# Patient Record
Sex: Female | Born: 1980 | Race: Black or African American | Hispanic: No | Marital: Single | Smoking: Never smoker
Health system: Southern US, Community
[De-identification: ages and names within clinical notes are randomized; demographics above are authoritative.]

---
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
## 2003-08-31 ENCOUNTER — Emergency Department (HOSPITAL_COMMUNITY): Admission: EM | Admit: 2003-08-31 | Discharge: 2003-09-01 | Payer: Self-pay | Admitting: Emergency Medicine

## 2003-09-03 ENCOUNTER — Emergency Department (HOSPITAL_COMMUNITY): Admission: EM | Admit: 2003-09-03 | Discharge: 2003-09-03 | Payer: Self-pay | Admitting: Emergency Medicine

## 2006-06-15 ENCOUNTER — Emergency Department (HOSPITAL_COMMUNITY): Admission: EM | Admit: 2006-06-15 | Discharge: 2006-06-15 | Payer: Self-pay | Admitting: Emergency Medicine

## 2006-08-13 ENCOUNTER — Emergency Department (HOSPITAL_COMMUNITY): Admission: EM | Admit: 2006-08-13 | Discharge: 2006-08-14 | Payer: Self-pay | Admitting: Emergency Medicine

## 2006-08-21 ENCOUNTER — Other Ambulatory Visit: Admission: RE | Admit: 2006-08-21 | Discharge: 2006-08-21 | Payer: Self-pay | Admitting: Obstetrics and Gynecology

## 2006-08-31 ENCOUNTER — Emergency Department (HOSPITAL_COMMUNITY): Admission: EM | Admit: 2006-08-31 | Discharge: 2006-08-31 | Payer: Self-pay | Admitting: Emergency Medicine

## 2006-09-02 ENCOUNTER — Inpatient Hospital Stay (HOSPITAL_COMMUNITY): Admission: AD | Admit: 2006-09-02 | Discharge: 2006-09-02 | Payer: Self-pay | Admitting: Obstetrics and Gynecology

## 2006-11-11 ENCOUNTER — Ambulatory Visit (HOSPITAL_COMMUNITY): Admission: RE | Admit: 2006-11-11 | Discharge: 2006-11-11 | Payer: Self-pay | Admitting: Obstetrics and Gynecology

## 2007-03-26 ENCOUNTER — Inpatient Hospital Stay (HOSPITAL_COMMUNITY): Admission: AD | Admit: 2007-03-26 | Discharge: 2007-03-28 | Payer: Self-pay | Admitting: Obstetrics and Gynecology

## 2007-05-03 ENCOUNTER — Emergency Department (HOSPITAL_COMMUNITY): Admission: EM | Admit: 2007-05-03 | Discharge: 2007-05-03 | Payer: Self-pay | Admitting: Emergency Medicine

## 2007-07-05 ENCOUNTER — Emergency Department (HOSPITAL_COMMUNITY): Admission: EM | Admit: 2007-07-05 | Discharge: 2007-07-05 | Payer: Self-pay | Admitting: Emergency Medicine

## 2008-05-05 ENCOUNTER — Emergency Department (HOSPITAL_COMMUNITY): Admission: EM | Admit: 2008-05-05 | Discharge: 2008-05-06 | Payer: Self-pay | Admitting: Emergency Medicine

## 2009-02-06 ENCOUNTER — Emergency Department (HOSPITAL_COMMUNITY): Admission: EM | Admit: 2009-02-06 | Discharge: 2009-02-06 | Payer: Self-pay | Admitting: Emergency Medicine

## 2009-02-10 ENCOUNTER — Emergency Department (HOSPITAL_COMMUNITY): Admission: EM | Admit: 2009-02-10 | Discharge: 2009-02-10 | Payer: Self-pay | Admitting: Emergency Medicine

## 2010-06-06 LAB — URINALYSIS, ROUTINE W REFLEX MICROSCOPIC
Glucose, UA: NEGATIVE mg/dL
Protein, ur: NEGATIVE mg/dL
Specific Gravity, Urine: 1.014 (ref 1.005–1.030)
Urobilinogen, UA: 1 mg/dL (ref 0.0–1.0)

## 2010-06-06 LAB — URINE MICROSCOPIC-ADD ON

## 2010-06-06 LAB — URINE CULTURE

## 2010-06-06 LAB — RAPID STREP SCREEN (MED CTR MEBANE ONLY): Streptococcus, Group A Screen (Direct): NEGATIVE

## 2010-06-15 LAB — POCT PREGNANCY, URINE: Preg Test, Ur: NEGATIVE

## 2010-06-15 LAB — URINE MICROSCOPIC-ADD ON

## 2010-06-15 LAB — DIFFERENTIAL
Basophils Absolute: 0 10*3/uL (ref 0.0–0.1)
Basophils Relative: 0 % (ref 0–1)
Eosinophils Absolute: 0.1 10*3/uL (ref 0.0–0.7)
Eosinophils Relative: 1 % (ref 0–5)
Monocytes Absolute: 0.7 10*3/uL (ref 0.1–1.0)

## 2010-06-15 LAB — CBC
HCT: 36.7 % (ref 36.0–46.0)
Hemoglobin: 12.5 g/dL (ref 12.0–15.0)
MCHC: 34 g/dL (ref 30.0–36.0)
MCV: 86.8 fL (ref 78.0–100.0)
RDW: 14.1 % (ref 11.5–15.5)

## 2010-06-15 LAB — URINALYSIS, ROUTINE W REFLEX MICROSCOPIC
Glucose, UA: NEGATIVE mg/dL
Protein, ur: 30 mg/dL — AB

## 2010-06-15 LAB — POCT I-STAT, CHEM 8
BUN: 5 mg/dL — ABNORMAL LOW (ref 6–23)
Calcium, Ion: 1.13 mmol/L (ref 1.12–1.32)
Creatinine, Ser: 1 mg/dL (ref 0.4–1.2)
Sodium: 144 mEq/L (ref 135–145)
TCO2: 26 mmol/L (ref 0–100)

## 2010-06-15 LAB — URINE CULTURE: Colony Count: >=100000

## 2010-11-23 LAB — CBC
HCT: 32.8 — ABNORMAL LOW
HCT: 38
Hemoglobin: 11 — ABNORMAL LOW
Hemoglobin: 12.9
MCHC: 33.6
MCV: 85.6
MCV: 88
Platelets: 319
RBC: 4.44
RDW: 12.6
WBC: 14.3 — ABNORMAL HIGH

## 2010-12-20 LAB — KLEIHAUER-BETKE STAIN
Fetal Cells %: 0
Quantitation Fetal Hemoglobin: 0

## 2010-12-20 LAB — CBC
MCHC: 32.9
RDW: 12.5

## 2010-12-21 LAB — WET PREP, GENITAL
Clue Cells Wet Prep HPF POC: NONE SEEN
Trich, Wet Prep: NONE SEEN
WBC, Wet Prep HPF POC: NONE SEEN
Yeast Wet Prep HPF POC: NONE SEEN

## 2010-12-21 LAB — URINALYSIS, ROUTINE W REFLEX MICROSCOPIC
Glucose, UA: NEGATIVE
Protein, ur: NEGATIVE
Urobilinogen, UA: 1

## 2010-12-21 LAB — URINE CULTURE

## 2010-12-21 LAB — GC/CHLAMYDIA PROBE AMP, GENITAL: GC Probe Amp, Genital: NEGATIVE

## 2010-12-21 LAB — URINE MICROSCOPIC-ADD ON

## 2010-12-24 ENCOUNTER — Emergency Department (HOSPITAL_COMMUNITY)
Admission: EM | Admit: 2010-12-24 | Discharge: 2010-12-24 | Disposition: A | Discharge status: Home / Self Care | Payer: Self-pay | Attending: Emergency Medicine | Admitting: Emergency Medicine

## 2010-12-24 DIAGNOSIS — R3915 Urgency of urination: Secondary | ICD-10-CM | POA: Insufficient documentation

## 2010-12-24 DIAGNOSIS — N390 Urinary tract infection, site not specified: Secondary | ICD-10-CM | POA: Insufficient documentation

## 2010-12-24 DIAGNOSIS — R300 Dysuria: Secondary | ICD-10-CM | POA: Insufficient documentation

## 2010-12-24 DIAGNOSIS — R350 Frequency of micturition: Secondary | ICD-10-CM | POA: Insufficient documentation

## 2010-12-24 DIAGNOSIS — R319 Hematuria, unspecified: Secondary | ICD-10-CM | POA: Insufficient documentation

## 2010-12-24 LAB — URINALYSIS, ROUTINE W REFLEX MICROSCOPIC
Nitrite: POSITIVE — AB
Protein, ur: 30 mg/dL — AB
Urobilinogen, UA: 1 mg/dL (ref 0.0–1.0)

## 2010-12-24 LAB — POCT PREGNANCY, URINE: Preg Test, Ur: NEGATIVE

## 2010-12-24 LAB — URINE MICROSCOPIC-ADD ON

## 2011-05-25 ENCOUNTER — Emergency Department (HOSPITAL_COMMUNITY)
Admission: EM | Admit: 2011-05-25 | Discharge: 2011-05-25 | Disposition: A | Discharge status: Home / Self Care | Payer: Self-pay | Attending: Emergency Medicine | Admitting: Emergency Medicine

## 2011-05-25 ENCOUNTER — Encounter (HOSPITAL_COMMUNITY): Payer: Self-pay | Admitting: Emergency Medicine

## 2011-05-25 DIAGNOSIS — A64 Unspecified sexually transmitted disease: Secondary | ICD-10-CM | POA: Insufficient documentation

## 2011-05-25 DIAGNOSIS — L293 Anogenital pruritus, unspecified: Secondary | ICD-10-CM | POA: Insufficient documentation

## 2011-05-25 LAB — URINALYSIS, ROUTINE W REFLEX MICROSCOPIC
Glucose, UA: NEGATIVE mg/dL
Hgb urine dipstick: NEGATIVE
Ketones, ur: NEGATIVE mg/dL
pH: 7.5 (ref 5.0–8.0)

## 2011-05-25 LAB — WET PREP, GENITAL: Trich, Wet Prep: NONE SEEN

## 2011-05-25 LAB — URINE MICROSCOPIC-ADD ON

## 2011-05-25 MED ORDER — AZITHROMYCIN 250 MG PO TABS
1000.0000 mg | ORAL_TABLET | Freq: Once | ORAL | Status: AC
  Administered 2011-05-25: 1000 mg via ORAL
  Filled 2011-05-25: qty 4

## 2011-05-25 MED ORDER — CEFTRIAXONE SODIUM 250 MG IJ SOLR
250.0000 mg | Freq: Once | INTRAMUSCULAR | Status: AC
  Administered 2011-05-25: 250 mg via INTRAMUSCULAR
  Filled 2011-05-25: qty 250

## 2011-05-25 MED ORDER — LIDOCAINE HCL (PF) 1 % IJ SOLN
INTRAMUSCULAR | Status: AC
  Administered 2011-05-25: 22:00:00
  Filled 2011-05-25: qty 5

## 2011-05-25 NOTE — ED Notes (Signed)
Lab called and add on Preg test ordered.

## 2011-05-25 NOTE — Discharge Instructions (Signed)
You have been treated in the emergency department for an infection, possibly sexually transmitted. Results of your gonorrhea and chlamydia tests are pending and you will be notified if they are positive. It is very important to practice safe sex and use condoms when sexually active. If your results are positive you need to notify all sexual partners so they can be treated as well. The website http://www.dontspreadit.com/ can be used to send anonymous text messages or emails to alert sexual contacts. Follow up with your doctor, or OBGYN in regards to today's visit.   ° °Gonorrhea and Chlamydia °SYMPTOMS  °In females, symptoms may go unnoticed. Symptoms that are more noticeable can include:  °Belly (abdominal) pain.  °Painful intercourse.  °Watery mucous-like discharge from the vagina.  °Miscarriage.  °Discomfort when urinating.  °Inflammation of the rectum.  °Abnormal gray-green frothy vaginal discharge  °Vaginal itching and irritatio  °Itching and irritation of the area outside the vagina.   °Painful urination.  °Bleeding after sexual intercourse.  °In males, symptoms include:  °Burning with urination.  °Pain in the testicles.  °Watery mucous-like discharge from the penis.  °It can cause longstanding (chronic) pelvic pain after frequent infections.  °TREATMENT  °PID can cause women to not be able to have children (sterile) if left untreated or if half-treated.  It is important to finish ALL medications given to you.  °This is a sexually transmitted infection. So you are also at risk for other sexually transmitted diseases, including HIV (AIDS), it is recommended that you get tested. °HOME CARE INSTRUCTIONS  °Warning: This infection is contagious. Do not have sex until treatment is completed. Follow up at your caregiver's office or the clinic to which you were referred. If your diagnosis (learning what is wrong) is confirmed by culture or some other method, your recent sexual contacts need treatment. Even if they are  symptom free or have a negative culture or evaluation, they should be treated.  °PREVENTION  °Women should use sanitary pads instead of tampons for vaginal discharge.  °Wipe front to back after using the toilet and avoid douching.   °Practice safe sex, use condoms, have only one sex partner and be sure your sex partner is not having sex with others.  °Ask your caregiver to test you for chlamydia at your regular checkups or sooner if you are having symptoms.  °Ask for further information if you are pregnant.  °SEEK IMMEDIATE MEDICAL CARE IF:  °You develop an oral temperature above 102° F (38.9° C), not controlled by medications or lasting more than 2 days.  °You develop an increase in pain.  °You develop any type of abnormal discharge.  °You develop vaginal bleeding and it is not time for your period.  °You develop painful intercourse.  °  ° ° °RESOURCE GUIDE ° °Dental Problems ° °Patients with Medicaid: °[facility]                     [facility] °[address]                                           [address] °Phone:  [phone]                                                    Phone:  [phone] ° °If unable to pay or uninsured, contact:  [facility] or [facility] to become qualified for the adult dental clinic. ° °Chronic Pain Problems °Contact [facility]  [phone] °Patients need to be referred by their primary care doctor. ° °Insufficient Money for Medicine °Contact [employer]:  call "211" or [facility] 271-5999. ° °No Primary Care Doctor °Call [facility]  832-8000 °Other agencies that provide inexpensive medical care °   [facility]  832-8035 °   [facility]  832-7272 °   [facility]  271-5999 °   [facility]  832-4777 °   [facility]  373-0678 °   [facility]  [phone] ° °Psychological Services °[facility]  [phone] °[facility]   [phone] °[facility]   800 853-5163 (emergency services 641-4993) ° °Substance Abuse Resources °[facility]  336-882-2125 °[facility] 336-784-9470 °[facility] 336-285-9073 °[facility] 336-845-3988 °Residential & Outpatient Substance Abuse Program  800-659-3381 ° °Abuse/Neglect °[place] [facility] (336) 641-3795 °[place] [facility] 800-378-5315 (After Hours) ° °Emergency Shelter °[facility] (336) 271-5985 ° °Maternity Homes °[facility] (336) 275-9566 °[facility] (704) 372-4663 ° °MRSA Hotline #:   832-7006 ° ° ° °[place] [facility] ° °[facility] [facility]     [employer]                          [facility] °[address] [place]                       [address]      [address]  °[place]                                                [place]                            [place] °Phone:  349-3220                                   Phone:  [phone]                 Phone:  342-8140 ° °[facility] °Phone:  342-8316 ° °[place] [facility] °(336) 342-1394 °(336) 342-3537 (After Hours) ° ° °

## 2011-05-25 NOTE — ED Provider Notes (Signed)
Patient to resume Dr. Patrica Duel.  Patient emergency department with chief complaint of vaginal itching.  There was mild tenderness on pelvic exam with no cervical motion tenderness.  Patient is pending results of the wet prep prior to discharge and we'll treat patient accordingly.  Microscopic wet-mount exam shows white blood cells TNTC.  Will treat patient prophylactically for gonorrhea and Chlamydia.Patient to be discharged with instructions to follow up with OBGYN ([facility] clinic) Discussed importance of using protection when sexually active. Pt understands that they have GC/Chlamydia cultures pending and that they will need to inform all sexual partners if results return positive. Pt has been treated prophylacticly with azithromycin and rocephin due to pts history, pelvic exam, and wet prep with increased WBCs. Pt not concerning for PID because hemodynamically stable and no cervical motion tenderness on pelvic exam.   Jaci Carrel, PA-C 05/25/11 2109

## 2011-05-25 NOTE — ED Notes (Signed)
Patient states that she has had a vaginal discharge for a couple of days and thinks it is a yeast infection.  Denies being on any antibiotics

## 2011-05-25 NOTE — ED Notes (Signed)
Onset 2 days ago vaginal itching slight discharge clear white. Denies urinary complaints.

## 2011-05-25 NOTE — ED Provider Notes (Signed)
History     CSN: [id number]  Arrival date & time 05/25/11  1705   First MD Initiated Contact with Patient 05/25/11 1920      Chief Complaint  Patient presents with  . Vaginal Itching   patient has had vaginal itching, and vaginal irritation over the past 2 days. She has a new white discharge. She denies any dysuria or hematuria. She has had no fevers no abdominal pain. She apparently is actually active. She did try Monistat over the counter, but states it did not help her symptoms.  (Consider location/radiation/quality/duration/timing/severity/associated sxs/prior treatment) HPI  History reviewed. No pertinent past medical history.  History reviewed. No pertinent past surgical history.  No family history on file.  History  Substance Use Topics  . Smoking status: Never Smoker   . Smokeless tobacco: Not on file  . Alcohol Use: No    OB History    Grav Para Term Preterm Abortions TAB SAB Ect Mult Living                  Review of Systems  All other systems reviewed and are negative.    Allergies  Review of patient's allergies indicates no known allergies.  Home Medications  No current outpatient prescriptions on file.  BP 113/60  Pulse 107  Temp(Src) 98.3 F (36.8 C) (Oral)  Resp 20  SpO2 100%  Physical Exam  Nursing note and vitals reviewed. Constitutional: She appears well-developed and well-nourished. No distress.  HENT:  Head: Normocephalic.  Eyes: Pupils are equal, round, and reactive to light.  Cardiovascular: Normal heart sounds.   Pulmonary/Chest: Breath sounds normal.  Abdominal: Soft.  Genitourinary: Vagina normal.       Female nursing chaperone present during exam. No obvious external lesions. White, frothy discharge at the cervix noted. No bleeding no masses.  Musculoskeletal: Normal range of motion.  Neurological: She is alert.  Skin: Skin is warm and dry.    ED Course  Procedures (including critical care time)  Labs Reviewed    URINALYSIS, ROUTINE W REFLEX MICROSCOPIC - Abnormal; Notable for the following:    APPearance HAZY (*)    Leukocytes, UA LARGE (*)    All other components within normal limits  URINE MICROSCOPIC-ADD ON - Abnormal; Notable for the following:    Squamous Epithelial / LPF FEW (*)    Bacteria, UA FEW (*)    All other components within normal limits  GC/CHLAMYDIA PROBE AMP, GENITAL  WET PREP, GENITAL  PREGNANCY, URINE   No results found.   No diagnosis found.    MDM  Patient is seen and examined, initial history and physical is completed. Evaluation initiated    GC and Chlamydia probe has been sent off. Pelvic exam done with female nursing supervision. Wet prep, and urine also pending.    Almena Hokenson A. Patrica Duel, MD 05/25/11 1950

## 2011-05-26 LAB — GC/CHLAMYDIA PROBE AMP, GENITAL: GC Probe Amp, Genital: NEGATIVE

## 2011-06-02 NOTE — ED Provider Notes (Signed)
Medical screening examination/treatment/procedure(s) were conducted as a shared visit with non-physician practitioner(s) and myself.  I personally evaluated the patient during the encounter   Byrl Latin A. Patrica Duel, MD 06/02/11 [phone]

## 2012-08-27 ENCOUNTER — Emergency Department (HOSPITAL_BASED_OUTPATIENT_CLINIC_OR_DEPARTMENT_OTHER)
Admission: EM | Admit: 2012-08-27 | Discharge: 2012-08-28 | Disposition: A | Discharge status: Home / Self Care | Payer: 59 | Attending: Emergency Medicine | Admitting: Emergency Medicine

## 2012-08-27 DIAGNOSIS — N390 Urinary tract infection, site not specified: Secondary | ICD-10-CM | POA: Insufficient documentation

## 2012-08-27 DIAGNOSIS — Z3202 Encounter for pregnancy test, result negative: Secondary | ICD-10-CM | POA: Insufficient documentation

## 2012-08-27 DIAGNOSIS — R1013 Epigastric pain: Secondary | ICD-10-CM | POA: Insufficient documentation

## 2012-08-27 NOTE — ED Notes (Signed)
Pt c/o pain in throat and upper abd pain.

## 2012-08-28 LAB — URINE MICROSCOPIC-ADD ON

## 2012-08-28 LAB — CBC WITH DIFFERENTIAL/PLATELET
Hemoglobin: 13.1 g/dL (ref 12.0–15.0)
Lymphs Abs: 3.7 10*3/uL (ref 0.7–4.0)
Monocytes Relative: 9 % (ref 3–12)
Neutro Abs: 3.8 10*3/uL (ref 1.7–7.7)
Neutrophils Relative %: 45 % (ref 43–77)
RBC: 4.51 MIL/uL (ref 3.87–5.11)

## 2012-08-28 LAB — BASIC METABOLIC PANEL
BUN: 9 mg/dL (ref 6–23)
Chloride: 104 mEq/L (ref 96–112)
Glucose, Bld: 89 mg/dL (ref 70–99)
Potassium: 3.3 mEq/L — ABNORMAL LOW (ref 3.5–5.1)

## 2012-08-28 LAB — URINALYSIS, ROUTINE W REFLEX MICROSCOPIC
Bilirubin Urine: NEGATIVE
Hgb urine dipstick: NEGATIVE
Nitrite: NEGATIVE
Specific Gravity, Urine: 1.015 (ref 1.005–1.030)
pH: 5.5 (ref 5.0–8.0)

## 2012-08-28 LAB — RAPID STREP SCREEN (MED CTR MEBANE ONLY): Streptococcus, Group A Screen (Direct): NEGATIVE

## 2012-08-28 LAB — MONONUCLEOSIS SCREEN: Mono Screen: NEGATIVE

## 2012-08-28 MED ORDER — NITROFURANTOIN MONOHYD MACRO 100 MG PO CAPS: 100.0000 mg | ORAL_CAPSULE | Freq: Two times a day (BID) | ORAL | Status: DC

## 2012-08-28 MED ORDER — NITROFURANTOIN MONOHYD MACRO 100 MG PO CAPS
100.0000 mg | ORAL_CAPSULE | Freq: Once | ORAL | Status: AC
  Administered 2012-08-28: 100 mg via ORAL
  Filled 2012-08-28: qty 1

## 2012-08-28 NOTE — ED Notes (Signed)
MD at bedside. 

## 2012-08-28 NOTE — ED Provider Notes (Signed)
History    CSN: [id number] Arrival date & time 08/27/12  2222  First MD Initiated Contact with Patient 08/28/12 0156     Chief Complaint  Patient presents with  . Sore Throat   (Consider location/radiation/quality/duration/timing/severity/associated sxs/prior Treatment) HPI This is a 31 year old female with a one-week history of characterized discomfort in her throat. It is of moderate severity has been intermittent. It is worse with swallowing. She states it does not feel like a true "sore throat". It has not been associated with any cold symptoms. It has not been associated with a headache but she has felt generally weak recently. She does not notice that it is worse at any given time, such as when she wakes up in the morning. She also had epigastric discomfort earlier that has nearly resolved. She does not associate the epigastric pain with her throat discomfort. She has been taking Prilosec for the past 4 days without relief of her throat discomfort.   No past medical history on file. No past surgical history on file. No family history on file. History  Substance Use Topics  . Smoking status: Never Smoker   . Smokeless tobacco: Not on file  . Alcohol Use: No   OB History   Grav Para Term Preterm Abortions TAB SAB Ect Mult Living                 Review of Systems  All other systems reviewed and are negative.    Allergies  Review of patient's allergies indicates no known allergies.  Home Medications  No current outpatient prescriptions on file. BP 108/62  Pulse 78  Temp(Src) 98.1 F (36.7 C) (Oral)  Resp 18  SpO2 100% Physical Exam General: Well-developed, well-nourished female in no acute distress; appearance consistent with age of record HENT: normocephalic, atraumatic; no pharyngeal erythema or exudate Eyes: pupils equal round and reactive to light; extraocular muscles intact Neck: supple; no lymphadenopathy Heart: regular rate and rhythm Lungs: clear to  auscultation bilaterally Abdomen: soft; nondistended; nontender; no masses or hepatosplenomegaly; bowel sounds present Extremities: No deformity; full range of motion Neurologic: Awake, alert and oriented; motor function intact in all extremities and symmetric; no facial droop Skin: Warm and dry Psychiatric: Normal mood and affect    ED Course  Procedures (including critical care time)   MDM   Nursing notes and vitals signs, including pulse oximetry, reviewed.  Summary of this visit's results, reviewed by myself:  Labs:  Results for orders placed during the hospital encounter of 08/27/12 (from the past 24 hour(s))  RAPID STREP SCREEN     Status: None   Collection Time    08/28/12  2:00 AM      Result Value Range   Streptococcus, Group A Screen (Direct) NEGATIVE  NEGATIVE  URINALYSIS, ROUTINE W REFLEX MICROSCOPIC     Status: Abnormal   Collection Time    08/28/12  2:10 AM      Result Value Range   Color, Urine YELLOW  YELLOW   APPearance CLOUDY (*) CLEAR   Specific Gravity, Urine 1.015  1.005 - 1.030   pH 5.5  5.0 - 8.0   Glucose, UA NEGATIVE  NEGATIVE mg/dL   Hgb urine dipstick NEGATIVE  NEGATIVE   Bilirubin Urine NEGATIVE  NEGATIVE   Ketones, ur 15 (*) NEGATIVE mg/dL   Protein, ur NEGATIVE  NEGATIVE mg/dL   Urobilinogen, UA 0.2  0.0 - 1.0 mg/dL   Nitrite NEGATIVE  NEGATIVE   Leukocytes, UA LARGE (*) NEGATIVE  PREGNANCY, URINE     Status: None   Collection Time    08/28/12  2:10 AM      Result Value Range   Preg Test, Ur NEGATIVE  NEGATIVE  URINE MICROSCOPIC-ADD ON     Status: Abnormal   Collection Time    08/28/12  2:10 AM      Result Value Range   Squamous Epithelial / LPF FEW (*) RARE   WBC, UA 11-20  <3 WBC/hpf   RBC / HPF 0-2  <3 RBC/hpf   Bacteria, UA MANY (*) RARE   Urine-Other MUCOUS PRESENT    MONONUCLEOSIS SCREEN     Status: None   Collection Time    08/28/12  2:25 AM      Result Value Range   Mono Screen NEGATIVE  NEGATIVE  CBC WITH DIFFERENTIAL      Status: None   Collection Time    08/28/12  2:25 AM      Result Value Range   WBC 8.4  4.0 - 10.5 K/uL   RBC 4.51  3.87 - 5.11 MIL/uL   Hemoglobin 13.1  12.0 - 15.0 g/dL   HCT 16.1  09.6 - 04.5 %   MCV 80.9  78.0 - 100.0 fL   MCH 29.0  26.0 - 34.0 pg   MCHC 35.9  30.0 - 36.0 g/dL   RDW 40.9  81.1 - 91.4 %   Platelets 267  150 - 400 K/uL   Neutrophils Relative % 45  43 - 77 %   Neutro Abs 3.8  1.7 - 7.7 K/uL   Lymphocytes Relative 44  12 - 46 %   Lymphs Abs 3.7  0.7 - 4.0 K/uL   Monocytes Relative 9  3 - 12 %   Monocytes Absolute 0.7  0.1 - 1.0 K/uL   Eosinophils Relative 2  0 - 5 %   Eosinophils Absolute 0.1  0.0 - 0.7 K/uL   Basophils Relative 1  0 - 1 %   Basophils Absolute 0.1  0.0 - 0.1 K/uL  BASIC METABOLIC PANEL     Status: Abnormal   Collection Time    08/28/12  2:25 AM      Result Value Range   Sodium 141  135 - 145 mEq/L   Potassium 3.3 (*) 3.5 - 5.1 mEq/L   Chloride 104  96 - 112 mEq/L   CO2 26  19 - 32 mEq/L   Glucose, Bld 89  70 - 99 mg/dL   BUN 9  6 - 23 mg/dL   Creatinine, Ser 7.82  0.50 - 1.10 mg/dL   Calcium 9.7  8.4 - 95.6 mg/dL   GFR calc non Af Amer >90  >90 mL/min   GFR calc Af Amer >90  >90 mL/min   2:58 AM Because of the patient's throat discomfort is unclear. She was advised to continue the Prilosec. We will treat her urinary tract infection.  Hanley Seamen, MD 08/28/12 [phone]

## 2012-08-28 NOTE — ED Notes (Signed)
MD at bedside giving test results and DC plan of care.

## 2012-08-29 LAB — CULTURE, GROUP A STREP

## 2012-08-29 LAB — URINE CULTURE: Colony Count: 55000

## 2014-04-03 ENCOUNTER — Emergency Department (HOSPITAL_BASED_OUTPATIENT_CLINIC_OR_DEPARTMENT_OTHER)
Admission: EM | Admit: 2014-04-03 | Discharge: 2014-04-03 | Disposition: A | Discharge status: Home / Self Care | Payer: Medicaid Other | Attending: Emergency Medicine | Admitting: Emergency Medicine

## 2014-04-03 ENCOUNTER — Encounter (HOSPITAL_BASED_OUTPATIENT_CLINIC_OR_DEPARTMENT_OTHER): Payer: Self-pay | Admitting: *Deleted

## 2014-04-03 DIAGNOSIS — N390 Urinary tract infection, site not specified: Secondary | ICD-10-CM | POA: Diagnosis not present

## 2014-04-03 DIAGNOSIS — Z720 Tobacco use: Secondary | ICD-10-CM | POA: Diagnosis not present

## 2014-04-03 DIAGNOSIS — Z3202 Encounter for pregnancy test, result negative: Secondary | ICD-10-CM | POA: Insufficient documentation

## 2014-04-03 DIAGNOSIS — R300 Dysuria: Secondary | ICD-10-CM | POA: Diagnosis present

## 2014-04-03 LAB — URINALYSIS, ROUTINE W REFLEX MICROSCOPIC
Bilirubin Urine: NEGATIVE
Glucose, UA: NEGATIVE mg/dL
KETONES UR: 40 mg/dL — AB
Nitrite: POSITIVE — AB
PROTEIN: NEGATIVE mg/dL
SPECIFIC GRAVITY, URINE: 1.019 (ref 1.005–1.030)
Urobilinogen, UA: 0.2 mg/dL (ref 0.0–1.0)
pH: 5 (ref 5.0–8.0)

## 2014-04-03 LAB — URINE MICROSCOPIC-ADD ON

## 2014-04-03 LAB — PREGNANCY, URINE: PREG TEST UR: NEGATIVE

## 2014-04-03 MED ORDER — CEPHALEXIN 500 MG PO CAPS: 500.0000 mg | ORAL_CAPSULE | Freq: Two times a day (BID) | ORAL | Status: DC

## 2014-04-03 MED ORDER — PHENAZOPYRIDINE HCL 100 MG PO TABS
200.0000 mg | ORAL_TABLET | Freq: Once | ORAL | Status: AC
  Administered 2014-04-03: 200 mg via ORAL
  Filled 2014-04-03: qty 2

## 2014-04-03 MED ORDER — CEPHALEXIN 250 MG PO CAPS
500.0000 mg | ORAL_CAPSULE | Freq: Once | ORAL | Status: AC
  Administered 2014-04-03: 500 mg via ORAL
  Filled 2014-04-03: qty 2

## 2014-04-03 MED ORDER — PHENAZOPYRIDINE HCL 200 MG PO TABS: 200.0000 mg | ORAL_TABLET | Freq: Three times a day (TID) | ORAL | Status: DC

## 2014-04-03 NOTE — ED Notes (Addendum)
Pt states she has noticed blood in her urine and increased frequency of urination and decreased appetite

## 2014-04-03 NOTE — ED Notes (Signed)
Patient gave urine sample. 

## 2014-04-03 NOTE — ED Provider Notes (Signed)
CSN: [id number]     Arrival date & time 04/03/14  2141 History   First MD Initiated Contact with Patient 04/03/14 2203     Chief Complaint  Patient presents with  . Dysuria     (Consider location/radiation/quality/duration/timing/severity/associated sxs/prior Treatment) HPI   Sherri Johns is a 34 y.o. female complaining of hematuria, dysuria, urinary frequency, sensation of incomplete void with pressure and midline lower abdominal pain. Patient denies fever, chills, nausea, vomiting, abnormal vaginal discharge. Symptoms started approximately 4 days ago. She was improving and then worsened 2 days ago. She also reports soft stools approximately 4 per day starting 3 days ago as well.  History reviewed. No pertinent past medical history. History reviewed. No pertinent past surgical history. No family history on file. History  Substance Use Topics  . Smoking status: Current Every Day Smoker  . Smokeless tobacco: Not on file  . Alcohol Use: Yes   OB History    No data available     Review of Systems  10 systems reviewed and found to be negative, except as noted in the HPI.   Allergies  Review of patient's allergies indicates no known allergies.  Home Medications   Prior to Admission medications   Medication Sig Start Date End Date Taking? Authorizing Provider  nitrofurantoin, macrocrystal-monohydrate, (MACROBID) 100 MG capsule Take 1 capsule (100 mg total) by mouth 2 (two) times daily. X 7 days 08/28/12   Carlisle BeersJohn L Molpus, MD   BP 132/76 mmHg  Pulse 80  Temp(Src) 98.4 F (36.9 C) (Oral)  Resp 20  Ht 5' (1.524 m)  Wt 130 lb (58.968 kg)  BMI 25.39 kg/m2  SpO2 100%  LMP 03/26/2014 Physical Exam  Constitutional: She is oriented to person, place, and time. She appears well-developed and well-nourished. No distress.  HENT:  Head: Normocephalic and atraumatic.  Mouth/Throat: Oropharynx is clear and moist.  Eyes: Conjunctivae and EOM are normal. Pupils are equal, round, and  reactive to light.  Neck: Normal range of motion.  Cardiovascular: Normal rate, regular rhythm and intact distal pulses.   Pulmonary/Chest: Effort normal and breath sounds normal. No stridor. No respiratory distress. She has no wheezes. She has no rales. She exhibits no tenderness.  Abdominal: Soft. Bowel sounds are normal. She exhibits no distension and no mass. There is no tenderness. There is no rebound and no guarding.  Musculoskeletal: Normal range of motion. She exhibits no edema or tenderness.  Neurological: She is alert and oriented to person, place, and time.  Psychiatric: She has a normal mood and affect.  Nursing note and vitals reviewed.   ED Course  Procedures (including critical care time) Labs Review Labs Reviewed  URINALYSIS, ROUTINE W REFLEX MICROSCOPIC - Abnormal; Notable for the following:    APPearance CLOUDY (*)    Hgb urine dipstick MODERATE (*)    Ketones, ur 40 (*)    Nitrite POSITIVE (*)    Leukocytes, UA MODERATE (*)    All other components within normal limits  URINE MICROSCOPIC-ADD ON - Abnormal; Notable for the following:    Squamous Epithelial / LPF FEW (*)    Bacteria, UA MANY (*)    All other components within normal limits  PREGNANCY, URINE    Imaging Review No results found.   EKG Interpretation None      MDM   Final diagnoses:  UTI (lower urinary tract infection)    Filed Vitals:   04/03/14 2149  BP: 132/76  Pulse: 80  Temp: 98.4 F (36.9  C)  TempSrc: Oral  Resp: 20  Height: 5' (1.524 m)  Weight: 130 lb (58.968 kg)  SpO2: 100%    Medications  cephALEXin (KEFLEX) capsule 500 mg (500 mg Oral Given 04/03/14 2247)  phenazopyridine (PYRIDIUM) tablet 200 mg (200 mg Oral Given 04/03/14 2247)    Sherri Johns is a pleasant 34 y.o. female presenting with dysuria, hematuria, dysuria, hematuria, urinary frequency. Urinalysis consistent with infection. Abdominal exam is benign. No signs of pyelonephritis. Patient does have diarrhea, this  is coincidental.  Evaluation does not show pathology that would require ongoing emergent intervention or inpatient treatment. Pt is hemodynamically stable and mentating appropriately. Discussed findings and plan with patient/guardian, who agrees with care plan. All questions answered. Return precautions discussed and outpatient follow up given.   New Prescriptions   CEPHALEXIN (KEFLEX) 500 MG CAPSULE    Take 1 capsule (500 mg total) by mouth 2 (two) times daily.   PHENAZOPYRIDINE (PYRIDIUM) 200 MG TABLET    Take 1 tablet (200 mg total) by mouth 3 (three) times daily.         Wynetta Emery, PA-C 04/03/14 2321  Merrie Roof, MD 04/03/14 [phone]

## 2014-04-03 NOTE — Discharge Instructions (Signed)
Please follow with your primary care doctor in the next 2 days for a check-up. They must obtain records for further management.   Do not hesitate to return to the Emergency Department for any new, worsening or concerning symptoms.   Take your antibiotics as directed and to completion. You should never have any leftover antibiotics! Push fluids and stay well hydrated.   Any antibiotic use can reduce the efficacy of hormonal birth control. Please use back up method of contraception.    Urinary Tract Infection Urinary tract infections (UTIs) can develop anywhere along your urinary tract. Your urinary tract is your body's drainage system for removing wastes and extra water. Your urinary tract includes two kidneys, two ureters, a bladder, and a urethra. Your kidneys are a pair of bean-shaped organs. Each kidney is about the size of your fist. They are located below your ribs, one on each side of your spine. CAUSES Infections are caused by microbes, which are microscopic organisms, including fungi, viruses, and bacteria. These organisms are so small that they can only be seen through a microscope. Bacteria are the microbes that most commonly cause UTIs. SYMPTOMS  Symptoms of UTIs may vary by age and gender of the patient and by the location of the infection. Symptoms in young women typically include a frequent and intense urge to urinate and a painful, burning feeling in the bladder or urethra during urination. Older women and men are more likely to be tired, shaky, and weak and have muscle aches and abdominal pain. A fever may mean the infection is in your kidneys. Other symptoms of a kidney infection include pain in your back or sides below the ribs, nausea, and vomiting. DIAGNOSIS To diagnose a UTI, your caregiver will ask you about your symptoms. Your caregiver also will ask to provide a urine sample. The urine sample will be tested for bacteria and white blood cells. White blood cells are made by your  body to help fight infection. TREATMENT  Typically, UTIs can be treated with medication. Because most UTIs are caused by a bacterial infection, they usually can be treated with the use of antibiotics. The choice of antibiotic and length of treatment depend on your symptoms and the type of bacteria causing your infection. HOME CARE INSTRUCTIONS  If you were prescribed antibiotics, take them exactly as your caregiver instructs you. Finish the medication even if you feel better after you have only taken some of the medication.  Drink enough water and fluids to keep your urine clear or pale yellow.  Avoid caffeine, tea, and carbonated beverages. They tend to irritate your bladder.  Empty your bladder often. Avoid holding urine for long periods of time.  Empty your bladder before and after sexual intercourse.  After a bowel movement, women should cleanse from front to back. Use each tissue only once. SEEK MEDICAL CARE IF:   You have back pain.  You develop a fever.  Your symptoms do not begin to resolve within 3 days. SEEK IMMEDIATE MEDICAL CARE IF:   You have severe back pain or lower abdominal pain.  You develop chills.  You have nausea or vomiting.  You have continued burning or discomfort with urination. MAKE SURE YOU:   Understand these instructions.  Will watch your condition.  Will get help right away if you are not doing well or get worse. Document Released: 11/29/2004 Document Revised: 08/21/2011 Document Reviewed: 03/30/2011 [facility] LP[facility] Patient Information 2015 [place], [place]. This information is not intended to replace advice given to  you by your health care provider. Make sure you discuss any questions you have with your health care provider.

## 2014-04-03 NOTE — ED Notes (Signed)
C/o dysuria, "pressure with urination", hematuria, and abd pain. Also mentions diarrhea ( x3-4, watery & soft, brown), (denies: nv, fever, dizziness), took "old flagyl".

## 2014-04-07 LAB — URINE CULTURE: Colony Count: 70000

## 2014-04-09 ENCOUNTER — Telehealth: Payer: Self-pay | Admitting: *Deleted

## 2014-04-09 NOTE — ED Notes (Signed)
(+)  urine culture, no further treatment needed, J. Frens, Pharm 

## 2014-04-21 ENCOUNTER — Encounter (HOSPITAL_BASED_OUTPATIENT_CLINIC_OR_DEPARTMENT_OTHER): Payer: Self-pay | Admitting: Emergency Medicine

## 2014-04-21 ENCOUNTER — Emergency Department (HOSPITAL_BASED_OUTPATIENT_CLINIC_OR_DEPARTMENT_OTHER)
Admission: EM | Admit: 2014-04-21 | Discharge: 2014-04-21 | Disposition: A | Discharge status: Home / Self Care | Payer: Medicaid Other | Attending: Emergency Medicine | Admitting: Emergency Medicine

## 2014-04-21 DIAGNOSIS — Z720 Tobacco use: Secondary | ICD-10-CM | POA: Insufficient documentation

## 2014-04-21 DIAGNOSIS — N898 Other specified noninflammatory disorders of vagina: Secondary | ICD-10-CM | POA: Diagnosis present

## 2014-04-21 DIAGNOSIS — B9689 Other specified bacterial agents as the cause of diseases classified elsewhere: Secondary | ICD-10-CM

## 2014-04-21 DIAGNOSIS — Z79899 Other long term (current) drug therapy: Secondary | ICD-10-CM | POA: Diagnosis not present

## 2014-04-21 DIAGNOSIS — Z3202 Encounter for pregnancy test, result negative: Secondary | ICD-10-CM | POA: Diagnosis not present

## 2014-04-21 DIAGNOSIS — N760 Acute vaginitis: Secondary | ICD-10-CM | POA: Insufficient documentation

## 2014-04-21 LAB — URINE MICROSCOPIC-ADD ON

## 2014-04-21 LAB — URINALYSIS, ROUTINE W REFLEX MICROSCOPIC
Bilirubin Urine: NEGATIVE
GLUCOSE, UA: NEGATIVE mg/dL
Ketones, ur: NEGATIVE mg/dL
LEUKOCYTES UA: NEGATIVE
Nitrite: NEGATIVE
PROTEIN: NEGATIVE mg/dL
SPECIFIC GRAVITY, URINE: 1.017 (ref 1.005–1.030)
UROBILINOGEN UA: 0.2 mg/dL (ref 0.0–1.0)
pH: 5 (ref 5.0–8.0)

## 2014-04-21 LAB — WET PREP, GENITAL
Trich, Wet Prep: NONE SEEN
Yeast Wet Prep HPF POC: NONE SEEN

## 2014-04-21 LAB — PREGNANCY, URINE: PREG TEST UR: NEGATIVE

## 2014-04-21 MED ORDER — METRONIDAZOLE 500 MG PO TABS: 500.0000 mg | ORAL_TABLET | Freq: Two times a day (BID) | ORAL | Status: DC

## 2014-04-21 NOTE — Discharge Instructions (Signed)
Bacterial Vaginosis Bacterial vaginosis is a vaginal infection that occurs when the normal balance of bacteria in the vagina is disrupted. It results from an overgrowth of certain bacteria. This is the most common vaginal infection in women of childbearing age. Treatment is important to prevent complications, especially in pregnant women, as it can cause a premature delivery. CAUSES  Bacterial vaginosis is caused by an increase in harmful bacteria that are normally present in smaller amounts in the vagina. Several different kinds of bacteria can cause bacterial vaginosis. However, the reason that the condition develops is not fully understood. RISK FACTORS Certain activities or behaviors can put you at an increased risk of developing bacterial vaginosis, including:  Having a new sex partner or multiple sex partners.  Douching.  Using an intrauterine device (IUD) for contraception. Women do not get bacterial vaginosis from toilet seats, bedding, swimming pools, or contact with objects around them. SIGNS AND SYMPTOMS  Some women with bacterial vaginosis have no signs or symptoms. Common symptoms include:  Grey vaginal discharge.  A fishlike odor with discharge, especially after sexual intercourse.  Itching or burning of the vagina and vulva.  Burning or pain with urination. DIAGNOSIS  Your health care provider will take a medical history and examine the vagina for signs of bacterial vaginosis. A sample of vaginal fluid may be taken. Your health care provider will look at this sample under a microscope to check for bacteria and abnormal cells. A vaginal pH test may also be done.  TREATMENT  Bacterial vaginosis may be treated with antibiotic medicines. These may be given in the form of a pill or a vaginal cream. A second round of antibiotics may be prescribed if the condition comes back after treatment.  HOME CARE INSTRUCTIONS   Only take over-the-counter or prescription medicines as  directed by your health care provider.  If antibiotic medicine was prescribed, take it as directed. Make sure you finish it even if you start to feel better.  Do not have sex until treatment is completed.  Tell all sexual partners that you have a vaginal infection. They should see their health care provider and be treated if they have problems, such as a mild rash or itching.  Practice safe sex by using condoms and only having one sex partner. SEEK MEDICAL CARE IF:   Your symptoms are not improving after 3 days of treatment.  You have increased discharge or pain.  You have a fever. MAKE SURE YOU:   Understand these instructions.  Will watch your condition.  Will get help right away if you are not doing well or get worse. FOR MORE INFORMATION  Centers for Disease Control and Prevention, Division of STD Prevention: www.cdc.gov/std American Sexual Health Association (ASHA): www.ashastd.org  Document Released: 02/19/2005 Document Revised: 12/10/2012 Document Reviewed: 10/01/2012 ExitCare Patient Information 2015 [employer]. This information is not intended to replace advice given to you by your health care provider. Make sure you discuss any questions you have with your health care provider.  

## 2014-04-21 NOTE — ED Provider Notes (Signed)
CSN: [id number]     Arrival date & time 04/21/14  0744 History   First MD Initiated Contact with Patient 04/21/14 [phone]     Chief Complaint  Patient presents with  . Vaginal Itching     (Consider location/radiation/quality/duration/timing/severity/associated sxs/prior Treatment) Patient is a 34 y.o. female presenting with vaginal itching. The history is provided by the patient.  Vaginal Itching This is a new problem. The current episode started more than 2 days ago. The problem occurs constantly. The problem has not changed since onset.Pertinent negatives include no abdominal pain and no shortness of breath. Nothing aggravates the symptoms. Nothing relieves the symptoms. She has tried nothing for the symptoms. The treatment provided no relief.    History reviewed. No pertinent past medical history. History reviewed. No pertinent past surgical history. No family history on file. History  Substance Use Topics  . Smoking status: Current Every Day Smoker  . Smokeless tobacco: Not on file  . Alcohol Use: Yes     Comment: occ   OB History    No data available     Review of Systems  Constitutional: Negative for fever.  Respiratory: Negative for cough and shortness of breath.   Gastrointestinal: Negative for vomiting and abdominal pain.  All other systems reviewed and are negative.     Allergies  Review of patient's allergies indicates no known allergies.  Home Medications   Prior to Admission medications   Medication Sig Start Date End Date Taking? Authorizing Provider  cephALEXin (KEFLEX) 500 MG capsule Take 1 capsule (500 mg total) by mouth 2 (two) times daily. 04/03/14  Yes Nicole Pisciotta, PA-C  phenazopyridine (PYRIDIUM) 200 MG tablet Take 1 tablet (200 mg total) by mouth 3 (three) times daily. 04/03/14  Yes Nicole Pisciotta, PA-C  nitrofurantoin, macrocrystal-monohydrate, (MACROBID) 100 MG capsule Take 1 capsule (100 mg total) by mouth 2 (two) times daily. X 7 days 08/28/12    Carlisle Beers Molpus, MD   BP 121/72 mmHg  Pulse 73  Temp(Src) 98.6 F (37 C) (Oral)  Resp 16  Ht 5' (1.524 m)  Wt 130 lb (58.968 kg)  BMI 25.39 kg/m2  SpO2 100%  LMP 03/26/2014 Physical Exam  Constitutional: She is oriented to person, place, and time. She appears well-developed and well-nourished. No distress.  HENT:  Head: Normocephalic and atraumatic.  Mouth/Throat: Oropharynx is clear and moist.  Eyes: EOM are normal. Pupils are equal, round, and reactive to light.  Neck: Normal range of motion. Neck supple.  Cardiovascular: Normal rate and regular rhythm.  Exam reveals no friction rub.   No murmur heard. Pulmonary/Chest: Effort normal and breath sounds normal. No respiratory distress. She has no wheezes. She has no rales.  Abdominal: Soft. She exhibits no distension. There is no tenderness. There is no rebound.  Genitourinary: There is no rash, tenderness or lesion on the right labia. There is no rash, tenderness or lesion on the left labia. Cervix exhibits no motion tenderness, no discharge and no friability. Right adnexum displays no mass, no tenderness and no fullness. Left adnexum displays no mass, no tenderness and no fullness.  Musculoskeletal: Normal range of motion. She exhibits no edema.  Neurological: She is alert and oriented to person, place, and time.  Skin: She is not diaphoretic.  Nursing note and vitals reviewed.   ED Course  Procedures (including critical care time) Labs Review Labs Reviewed  PREGNANCY, URINE  URINALYSIS, ROUTINE W REFLEX MICROSCOPIC    Imaging Review No results found.   EKG  Interpretation None      MDM   Final diagnoses:  Bacterial vaginosis    34F here with vaginal itching. Recently completed antibiotics for a UTI, which has cleared up. Now having vaginal itching. Mild relief with Monistat treatment, but symptoms returned one day later. No vaginal discharge. No major concern for STD testing, but she's amenable to having swabs  sent. AFVSS here. Belly benign. GU exam with thin white physiologic discharge. No tenderness. No major discharge to suggest yeast or BV.  Few clue cells on wet prep. Will treat for BV since symptomatic. Given flagyl.  Elwin MochaBlair Akshita Italiano, MD 04/21/14 [phone]

## 2014-04-21 NOTE — ED Notes (Signed)
Pt states she was treated for UTI a few weeks ago and developed itching without discharge on 04-17-2014.

## 2014-04-22 LAB — GC/CHLAMYDIA PROBE AMP (~~LOC~~) NOT AT ARMC
CHLAMYDIA, DNA PROBE: NEGATIVE
Neisseria Gonorrhea: NEGATIVE

## 2018-11-06 ENCOUNTER — Other Ambulatory Visit: Payer: Self-pay

## 2018-11-06 DIAGNOSIS — Z20822 Contact with and (suspected) exposure to covid-19: Secondary | ICD-10-CM

## 2018-11-07 LAB — NOVEL CORONAVIRUS, NAA: SARS-CoV-2, NAA: NOT DETECTED

## 2021-02-14 ENCOUNTER — Other Ambulatory Visit: Payer: Self-pay | Admitting: Endocrinology

## 2021-02-14 DIAGNOSIS — Z1231 Encounter for screening mammogram for malignant neoplasm of breast: Secondary | ICD-10-CM

## 2021-03-27 ENCOUNTER — Ambulatory Visit
Admission: RE | Admit: 2021-03-27 | Discharge: 2021-03-27 | Disposition: A | Discharge status: Home / Self Care | Payer: Medicaid Other | Source: Ambulatory Visit | Attending: Endocrinology | Admitting: Endocrinology

## 2021-03-27 DIAGNOSIS — Z1231 Encounter for screening mammogram for malignant neoplasm of breast: Secondary | ICD-10-CM

## 2021-06-15 ENCOUNTER — Encounter: Payer: Self-pay | Admitting: Emergency Medicine

## 2021-06-15 ENCOUNTER — Ambulatory Visit
Admission: EM | Admit: 2021-06-15 | Discharge: 2021-06-15 | Disposition: A | Discharge status: Home / Self Care | Payer: Medicaid Other | Attending: Student | Admitting: Student

## 2021-06-15 DIAGNOSIS — Z23 Encounter for immunization: Secondary | ICD-10-CM | POA: Diagnosis not present

## 2021-06-15 DIAGNOSIS — S41111A Laceration without foreign body of right upper arm, initial encounter: Secondary | ICD-10-CM | POA: Diagnosis not present

## 2021-06-15 MED ORDER — TETANUS-DIPHTH-ACELL PERTUSSIS 5-2.5-18.5 LF-MCG/0.5 IM SUSY
0.5000 mL | PREFILLED_SYRINGE | Freq: Once | INTRAMUSCULAR | Status: AC
  Administered 2021-06-15: 0.5 mL via INTRAMUSCULAR

## 2021-06-15 NOTE — ED Provider Notes (Signed)
?[facility] ? ? ? ?CSN: [id number] ?Arrival date & time: 06/15/21  1518 ? ? ?  ? ?History   ?Chief Complaint ?Chief Complaint  ?Patient presents with  ? Laceration  ? ? ?HPI ?Sherri Johns is a 41 y.o. female presenting with laceration from dog crate sustained 1.5 hours ago. History noncontributory, she is not immunocompromised. She is unsure of last Tdap. minimal bleeding and no sensation changes. ? ?HPI ? ?History reviewed. No pertinent past medical history. ? ?There are no problems to display for this patient. ? ? ?History reviewed. No pertinent surgical history. ? ?OB History   ?No obstetric history on file. ?  ? ? ? ?Home Medications   ? ?Prior to Admission medications   ?Medication Sig Start Date End Date Taking? Authorizing Provider  ?cephALEXin (KEFLEX) 500 MG capsule Take 1 capsule (500 mg total) by mouth 2 (two) times daily. 04/03/14   Pisciotta, Elmyra Ricks, PA-C  ?metroNIDAZOLE (FLAGYL) 500 MG tablet Take 1 tablet (500 mg total) by mouth 2 (two) times daily. One po bid x 7 days 04/21/14   Evelina Bucy, MD  ?nitrofurantoin, macrocrystal-monohydrate, (MACROBID) 100 MG capsule Take 1 capsule (100 mg total) by mouth 2 (two) times daily. X 7 days 08/28/12   Molpus, John, MD  ?phenazopyridine (PYRIDIUM) 200 MG tablet Take 1 tablet (200 mg total) by mouth 3 (three) times daily. 04/03/14   Pisciotta, Elmyra Ricks, PA-C  ? ? ?Family History ?Family History  ?Problem Relation Age of Onset  ? Breast cancer Mother   ? Breast cancer Sister   ? Breast cancer Paternal Aunt   ? ? ?Social History ?Social History  ? ?Tobacco Use  ? Smoking status: Every Day  ? Smokeless tobacco: Never  ?Vaping Use  ? Vaping Use: Never used  ?Substance Use Topics  ? Alcohol use: Yes  ?  Comment: occ  ? Drug use: No  ? ? ? ?Allergies   ?Patient has no known allergies. ? ? ?Review of Systems ?Review of Systems  ?Skin:  Positive for wound.  ?All other systems reviewed and are negative. ? ? ?Physical Exam ?Triage Vital Signs ?ED Triage Vitals  ?Enc  Vitals Group  ?   BP 06/15/21 1558 116/75  ?   Pulse Rate 06/15/21 1558 66  ?   Resp 06/15/21 1558 18  ?   Temp 06/15/21 1558 98.2 ?F (36.8 ?C)  ?   Temp Source 06/15/21 1558 Oral  ?   SpO2 --   ?   Weight --   ?   Height --   ?   Head Circumference --   ?   Peak Flow --   ?   Pain Score 06/15/21 1555 5  ?   Pain Loc --   ?   Pain Edu? --   ?   Excl. in [place]? --   ? ?No data found. ? ?Updated Vital Signs ?BP 116/75 (BP Location: Left Arm)   Pulse 66   Temp 98.2 ?F (36.8 ?C) (Oral)   Resp 18   LMP 06/01/2021 (Approximate)  ? ?Visual Acuity ?Right Eye Distance:   ?Left Eye Distance:   ?Bilateral Distance:   ? ?Right Eye Near:   ?Left Eye Near:    ?Bilateral Near:    ? ?Physical Exam ?Vitals reviewed.  ?Constitutional:   ?   General: She is not in acute distress. ?   Appearance: Normal appearance. She is not ill-appearing.  ?HENT:  ?   Head: Normocephalic and atraumatic.  ?  Pulmonary:  ?   Effort: Pulmonary effort is normal.  ?Skin: ?   Comments: See image below ?R upper arm with 44mm laceration, minimal bleeding. No surrounding sensation changes. No foreign body.  ?Neurological:  ?   General: No focal deficit present.  ?   Mental Status: She is alert and oriented to person, place, and time.  ?Psychiatric:     ?   Mood and Affect: Mood normal.     ?   Behavior: Behavior normal.     ?   Thought Content: Thought content normal.     ?   Judgment: Judgment normal.  ? ? ? ? ? ?UC Treatments / Results  ?Labs ?(all labs ordered are listed, but only abnormal results are displayed) ?Labs Reviewed - No data to display ? ?EKG ? ? ?Radiology ?No results found. ? ?Procedures ?Laceration Repair ? ?Date/Time: 06/15/2021 4:33 PM ?Performed by: Hazel Sams, PA-C ?Authorized by: Hazel Sams, PA-C  ? ?Consent:  ?  Consent obtained:  Verbal ?  Consent given by:  Patient and spouse ?  Risks discussed:  Infection, need for additional repair, poor cosmetic result and pain ?  Alternatives discussed:  No treatment ?Universal protocol:   ?  Procedure explained and questions answered to patient or proxy's satisfaction: yes   ?  Immediately prior to procedure, a time out was called: yes   ?  Patient identity confirmed:  Verbally with patient ?Anesthesia:  ?  Anesthesia method:  Topical application ?Laceration details:  ?  Location:  Shoulder/arm ?  Shoulder/arm location:  R upper arm ?  Length (cm):  1.2 ?Treatment:  ?  Area cleansed with:  Povidone-iodine ?  Irrigation solution:  Sterile saline ?Skin repair:  ?  Repair method:  Tissue adhesive ?Post-procedure details:  ?  Dressing:  Open (no dressing) ?  Procedure completion:  Tolerated well, no immediate complications (including critical care time) ? ?Medications Ordered in UC ?Medications  ?Tdap (BOOSTRIX) injection 0.5 mL (has no administration in time range)  ? ? ?Initial Impression / Assessment and Plan / UC Course  ?I have reviewed the triage vital signs and the nursing notes. ? ?Pertinent labs & imaging results that were available during my care of the patient were reviewed by me and considered in my medical decision making (see chart for details). ? ?  ? ?This patient is a very pleasant 41 y.o. year old female presenting with laceration R upper arm. She is neurovascularly intact and not immunocompromised. ? ?Initially planned to administer sutures. However, as the wound is shallow with minimal bleeding, and she does not mind scarring of the area, we ultimately proceeded with dermabond. She understands the area will scar, and scant bleeding could still occur.  ? ?Tdap administered. ? ?ED return precautions discussed. Patient verbalizes understanding and agreement.  ? ?Final Clinical Impressions(s) / UC Diagnoses  ? ?Final diagnoses:  ?Laceration of right upper arm, initial encounter  ?Need for Tdap vaccination  ? ? ? ?Discharge Instructions   ? ?  ?-I applied Dermabond glue today. This will help your laceration heal well. It's waterproof, so you can still wash your hands with gentle soap and  water, and shower. Avoid hydrogen peroxide or alcohol to cleanse. You can cover with a bandaid if you want to, but you don't have to. Dermabond comes off on its own in about 3-4 days.  ?-Come back and see [place] if the wound is getting worse: new redness, swelling, pain, discharge, etc.  ?-  Tylenol/ibuprofen for pain ? ? ? ? ? ?ED Prescriptions   ?None ?  ? ?PDMP not reviewed this encounter. ?  ?Hazel Sams, PA-C ?06/15/21 1634 ? ?

## 2021-06-15 NOTE — Discharge Instructions (Addendum)
-  I applied Dermabond glue today. This will help your laceration heal well. It's waterproof, so you can still wash your hands with gentle soap and water, and shower. Avoid hydrogen peroxide or alcohol to cleanse. You can cover with a bandaid if you want to, but you don't have to. Dermabond comes off on its own in about 3-4 days.  -Come back and see us if the wound is getting worse: new redness, swelling, pain, discharge, etc.  -Tylenol/ibuprofen for pain  

## 2021-06-15 NOTE — ED Triage Notes (Signed)
Pt presents with right arm laceration with her dog cage today. Pt last tetanus is unknown.  ?

## 2021-08-14 ENCOUNTER — Encounter (HOSPITAL_BASED_OUTPATIENT_CLINIC_OR_DEPARTMENT_OTHER): Payer: Medicaid Other | Attending: Internal Medicine | Admitting: Internal Medicine

## 2021-08-14 DIAGNOSIS — L910 Hypertrophic scar: Secondary | ICD-10-CM | POA: Diagnosis not present

## 2021-08-14 DIAGNOSIS — X58XXXA Exposure to other specified factors, initial encounter: Secondary | ICD-10-CM | POA: Insufficient documentation

## 2021-08-14 DIAGNOSIS — S41111A Laceration without foreign body of right upper arm, initial encounter: Secondary | ICD-10-CM | POA: Diagnosis present

## 2021-08-14 NOTE — Progress Notes (Signed)
[name], [name] ([id number]) Visit Report for 08/14/2021 Chief Complaint Document Details Patient Name: Date of Service: Sherri Johns 08/14/2021 9:45 A M Medical Record Number: [id number] Patient Account Number: [id number] Date of Birth/Sex: Treating RN: June 13, 1980 (41 y.o. Sherri Johns Primary Care Provider: PA Sherri Johns, [place] Other Clinician: Referring Provider: Treating Provider/Extender: Thomasenia Sales in Treatment: 0 Information Obtained from: Patient Chief Complaint 08/14/2021; keloid formation post healing from a laceration from a dog crate Electronic Signature(s) Signed: 08/14/2021 12:20:38 PM By: Geralyn Corwin DO Entered By: Geralyn Corwin on 08/14/2021 10:29:59 -------------------------------------------------------------------------------- HPI Details Patient Name: Date of Service: Sherri Fleet K. 08/14/2021 9:45 A M Medical Record Number: [id number] Patient Account Number: [id number] Date of Birth/Sex: Treating RN: 10-May-1980 (41 y.o. Sherri Johns Primary Care Provider: PA Sherri Johns, [place] Other Clinician: Referring Provider: Treating Provider/Extender: Thomasenia Sales in Treatment: 0 History of Present Illness HPI Description: 08/14/2021 Sherri Johns Is a 41 year old female with a past medical history of eczema that presents to the clinic for evaluation of a previous wound site with keloid formation. On 4/13 she visited the ED for an open wound to her right upper arm caused by a dog crate. Dermabond was used to close the area. She was also given Keflex at the time. She states that the area healed with this treatment but now has a keloid she would like addressed. She has been using Mederma on the scar. She also has a keloid on her chest from the previous wound. Electronic Signature(s) Signed: 08/14/2021 12:20:38 PM By: Geralyn Corwin DO Entered By: Geralyn Corwin on 08/14/2021  10:35:25 -------------------------------------------------------------------------------- Physical Exam Details Patient Name: Date of Service: Sherri Fleet K. 08/14/2021 9:45 A M Medical Record Number: [id number] Patient Account Number: [id number] Date of Birth/Sex: Treating RN: 12-Nov-1980 (41 y.o. Sherri Johns Primary Care Provider: PA Sherri Johns, [place] Other Clinician: Referring Provider: Treating Provider/Extender: Thomasenia Sales in Treatment: 0 Constitutional respirations regular, non-labored and within target range for patient.Marland Kitchen Psychiatric pleasant and cooperative. Notes Keloid to the right upper arm and chest. No open wounds. Electronic Signature(s) Signed: 08/14/2021 12:20:38 PM By: Geralyn Corwin DO Entered By: Geralyn Corwin on 08/14/2021 10:35:44 -------------------------------------------------------------------------------- Physician Orders Details Patient Name: Date of Service: Sherri Fleet K. 08/14/2021 9:45 A M Medical Record Number: [id number] Patient Account Number: [id number] Date of Birth/Sex: Treating RN: Oct 24, 1980 (41 y.o. Sherri Johns Primary Care Provider: PA Sherri Johns, [place] Other Clinician: Referring Provider: Treating Provider/Extender: Thomasenia Sales in Treatment: 0 Verbal / Phone Orders: No Diagnosis Coding Discharge From [facility] Services Discharge from Wound Care Center - Ensure to keep Dermatology appt on 09/01/2021 Continue to use Mederma daily. Check amazon for silicone sheets to cover the keloids. Referral to Dr. Arita Miss Plastics at [facility] : [facility]. Consults Plastic Surgery - Referral to Dr. Arita Miss Plastics for keloid areas to chest and right arm. Electronic Signature(s) Signed: 08/14/2021 12:20:38 PM By: Geralyn Corwin DO Entered By: Geralyn Corwin on 08/14/2021 10:35:52 Prescription 08/14/2021 -------------------------------------------------------------------------------- [name], [name] Geralyn Corwin DO Patient Name: Provider: 10/19/1980 [id number] Date of Birth: NPI#: [id number] Sex: DEA #: [phone] Phone #: License #: [name] [facility] Patient Address: [address] [place] [address] , [address] [place], [place] [address] [phone] Allergies No Known Drug Allergies Provider's Orders Plastic Surgery - Referral to Dr. Arita Miss Plastics for keloid areas to chest and right  arm. Hand Signature: Date(s): Electronic Signature(s) Signed: 08/14/2021 12:20:38 PM By: Geralyn CorwinHoffman, Bruno Leach DO Entered By: Geralyn CorwinHoffman, Ryanne Morand on 08/14/2021 10:35:52 -------------------------------------------------------------------------------- Problem List Details Patient Name: Date of Service: Sherri FleetFO H, SO WEI K. 08/14/2021 9:45 A M Medical Record Number: [id number] Patient Account Number: [id number] Date of Birth/Sex: Treating RN: Jun 06, 1980 (41 y.o. Sherri SilenceF) Sherri Johns Primary Care Provider: PA Sherri [place]IENT, [place] Other Clinician: Referring Provider: Treating Provider/Extender: Thomasenia SalesHoffman, Electa Sterry Vanderburg, Laura Weeks in Treatment: 0 Active Problems ICD-10 Encounter Code Description Active Date MDM Diagnosis S41.111A Laceration without foreign body of right upper arm, initial encounter 08/14/2021 No Yes L91.0 Hypertrophic scar 08/14/2021 No Yes Inactive Problems Resolved Problems Electronic Signature(s) Signed: 08/14/2021 12:20:38 PM By: Geralyn CorwinHoffman, Luberta Grabinski DO Entered By: Geralyn CorwinHoffman, Zella Dewan on 08/14/2021 10:29:01 -------------------------------------------------------------------------------- Progress Note Details Patient Name: Date of Service: Sherri FleetFO H, SO WEI K. 08/14/2021 9:45 A M Medical Record Number: [id number] Patient Account Number: [id number] Date of Birth/Sex: Treating RN: Jun 06, 1980 (41 y.o. Sherri SilenceF) Sherri Johns Primary Care Provider: PA Sherri [place]IENT, [place] Other Clinician: Referring Provider: Treating Provider/Extender: Thomasenia SalesHoffman,  Aveon Colquhoun Vanderburg, Laura Weeks in Treatment: 0 Subjective Chief Complaint Information obtained from Patient 08/14/2021; keloid formation post healing from a laceration from a dog crate History of Present Illness (HPI) 08/14/2021 Sherri Johns Is a 41 year old female with a past medical history of eczema that presents to the clinic for evaluation of a previous wound site with keloid formation. On 4/13 she visited the ED for an open wound to her right upper arm caused by a dog crate. Dermabond was used to close the area. She was also given Keflex at the time. She states that the area healed with this treatment but now has a keloid she would like addressed. She has been using Mederma on the scar. She also has a keloid on her chest from the previous wound. Patient History Allergies No Known Drug Allergies Family History Cancer - Mother,Siblings,Maternal Grandparents, Diabetes - Maternal Grandparents. Social History Never smoker, Marital Status - Single, Alcohol Use - Never, Drug Use - No History, Caffeine Use - Never. Medical A Surgical History Notes nd Constitutional Symptoms (General Health) eczema keloids in history chest Review of Systems (ROS) Eyes Denies complaints or symptoms of Dry Eyes, Vision Changes, Glasses / Contacts. Ear/Nose/Mouth/Throat Denies complaints or symptoms of Chronic sinus problems or rhinitis. Respiratory Denies complaints or symptoms of Chronic or frequent coughs, Shortness of Breath. Cardiovascular Denies complaints or symptoms of Chest pain. Gastrointestinal Denies complaints or symptoms of Frequent diarrhea, Nausea, Vomiting. Endocrine Denies complaints or symptoms of Heat/cold intolerance. Genitourinary Denies complaints or symptoms of Frequent urination. Integumentary (Skin) Complains or has symptoms of Wounds - right arm. Musculoskeletal Denies complaints or symptoms of Muscle Pain, Muscle Weakness. Neurologic Denies complaints or symptoms of  Numbness/parasthesias. Psychiatric Denies complaints or symptoms of Claustrophobia, Suicidal. Objective Constitutional respirations regular, non-labored and within target range for patient.. Vitals Time Taken: 10:00 AM, Height: 61 in, Source: Stated, Weight: 143 lbs, Source: Stated, BMI: 27, Temperature: 98 F, Pulse: 67 bpm, Respiratory Rate: 16 breaths/min, Blood Pressure: 127/81 mmHg. Psychiatric pleasant and cooperative. General Notes: Keloid to the right upper arm and chest. No open wounds. Assessment Active Problems ICD-10 Laceration without foreign body of right upper arm, initial encounter Hypertrophic scar Patient has a history of developing keloids. She developed a wound to her right upper arm recently following a laceration from a dog crate. The area has healed up but now has keloid formation. She is using Mederma. She can continue this but I also recommended trying  silicone sheets. She may benefit from steroid injections. I will refer to plastic surgery to discuss further treatment options. 46 minutes was spent on the encounter including face-to-face, EMR review and coordination of care Plan Discharge From [facility] Services: Discharge from Wound Care Center - Ensure to keep Dermatology appt on 09/01/2021 Continue to use Mederma daily. Check amazon for silicone sheets to cover the keloids. Referral to Dr. Arita Miss Plastics at [facility]. Consults ordered were: Plastic Surgery - Referral to Dr. Arita Miss Plastics for keloid areas to chest and right arm. 1. Continue Mederma and try silicone sheets 2. Plastic surgery referral 3. Follow-up as needed Electronic Signature(s) Signed: 08/14/2021 12:20:38 PM By: Geralyn Corwin DO Entered By: Geralyn Corwin on 08/14/2021 12:19:29 -------------------------------------------------------------------------------- HxROS Details Patient Name: Date of Service: Sherri Fleet K. 08/14/2021 9:45 A M Medical Record Number: [id number] Patient Account  Number: [id number] Date of Birth/Sex: Treating RN: [birth year] (41 y.o. Sherri Johns Primary Care Provider: PA Sherri Johns, [place] Other Clinician: Referring Provider: Treating Provider/Extender: Thomasenia Sales in Treatment: 0 Eyes Complaints and Symptoms: Negative for: Dry Eyes; Vision Changes; Glasses / Contacts Ear/Nose/Mouth/Throat Complaints and Symptoms: Negative for: Chronic sinus problems or rhinitis Respiratory Complaints and Symptoms: Negative for: Chronic or frequent coughs; Shortness of Breath Cardiovascular Complaints and Symptoms: Negative for: Chest pain Gastrointestinal Complaints and Symptoms: Negative for: Frequent diarrhea; Nausea; Vomiting Endocrine Complaints and Symptoms: Negative for: Heat/cold intolerance Genitourinary Complaints and Symptoms: Negative for: Frequent urination Integumentary (Skin) Complaints and Symptoms: Positive for: Wounds - right arm Musculoskeletal Complaints and Symptoms: Negative for: Muscle Pain; Muscle Weakness Neurologic Complaints and Symptoms: Negative for: Numbness/parasthesias Psychiatric Complaints and Symptoms: Negative for: Claustrophobia; Suicidal Constitutional Symptoms (General Health) Medical History: Past Medical History Notes: eczema keloids in history chest Immunological Oncologic Immunizations Pneumococcal Vaccine: Received Pneumococcal Vaccination: No Implantable Devices No devices added Family and Social History Cancer: Yes - Mother,Siblings,Maternal Grandparents; Diabetes: Yes - Maternal Grandparents; Never smoker; Marital Status - Single; Alcohol Use: Never; Drug Use: No History; Caffeine Use: Never; Financial Concerns: No; Food, Clothing or Shelter Needs: No; Support System Lacking: No; Transportation Concerns: No Electronic Signature(s) Signed: 08/14/2021 12:20:38 PM By: Geralyn Corwin DO Signed: 08/14/2021 4:29:25 PM By: Shawn Stall RN, BSN Entered By: Shawn Stall on 08/14/2021 10:07:20 -------------------------------------------------------------------------------- SuperBill Details Patient Name: Date of Service: Sherri Fleet K. 08/14/2021 Medical Record Number: [id number] Patient Account Number: [id number] Date of Birth/Sex: Treating RN: Jul 21, 1980 (41 y.o. Sherri Johns Primary Care Provider: PA Sherri Johns, [place] Other Clinician: Referring Provider: Treating Provider/Extender: Thomasenia Sales in Treatment: 0 Diagnosis Coding ICD-10 Codes Code Description S41.111A Laceration without foreign body of right upper arm, initial encounter L91.0 Hypertrophic scar Facility Procedures CPT4 Code: [id number] Description: 3 - WOUND CARE VISIT-LEV 3 EST PT Modifier: 1 Quantity: Physician Procedures : CPT4 Code Description Modifier [id number] - WC PHYS LEVEL 4 - NEW PT ICD-10 Diagnosis Description S41.111A Laceration without foreign body of right upper arm, initial encounter L91.0 Hypertrophic scar Quantity: 1 Electronic Signature(s) Signed: 08/14/2021 12:20:38 PM By: Geralyn Corwin DO Entered By: Geralyn Corwin on 08/14/2021 12:20:12

## 2021-08-14 NOTE — Progress Notes (Signed)
Sherri CocoFOH, Sherri K. ([id number]) Visit Report for 08/14/2021 Allergy List Details Patient Name: Date of Service: Sherri DunFO H, SO WEI K. 08/14/2021 9:45 A M Medical Record Number: [id number] Patient Account Number: [id number] Date of Birth/Sex: Treating RN: [birth year] (41 y.o. [name]) [name], [name] Primary Care [name]: PA Zenovia JordanIENT, [place] Other Clinician: Referring [name]: Treating [name]/Extender: Thomasenia SalesHoffman, Jessica Vanderburg, Laura Weeks in Treatment: 0 Allergies Active Allergies No Known Drug Allergies Allergy Notes Electronic Signature(s) Signed: 08/14/2021 4:29:25 PM By: Shawn Stalleaton, [name] RN, BSN Entered By: Shawn Stalleaton, [name] on 08/14/2021 10:02:24 -------------------------------------------------------------------------------- Arrival Information Details Patient Name: Date of Service: Sherri FleetFO H, SO WEI K. 08/14/2021 9:45 A M Medical Record Number: [id number] Patient Account Number: [id number] Date of Birth/Sex: Treating RN: [birth year] (40 y.o. [name]) [name], [name] Primary Care Tonni Mansour: PA Zenovia JordanIENT, [place] Other Clinician: Referring [name]: Treating [name]/Extender: Thomasenia SalesHoffman, Jessica Vanderburg, Laura Weeks in Treatment: 0 Visit Information Patient Arrived: Ambulatory Arrival Time: 10:00 Accompanied By: self Transfer Assistance: None Patient Identification Verified: Yes Secondary Verification Process Completed: Yes Patient Requires Transmission-Based Precautions: No Patient Has Alerts: No Electronic Signature(s) Signed: 08/14/2021 4:29:25 PM By: Shawn Stalleaton, [name] RN, BSN Entered By: Shawn Stalleaton, [name] on 08/14/2021 10:01:44 -------------------------------------------------------------------------------- Clinic Level of Care Assessment Details Patient Name: Date of Service: Sherri DunFO H, SO WEI K. 08/14/2021 9:45 A M Medical Record Number: [id number] Patient Account Number: [id number] Date of Birth/Sex: Treating RN: [birth year] (41 y.o. [name]) [name], [name] Primary Care Danon Lograsso: PA Zenovia JordanIENT, [place] Other Clinician: Referring  [name]: Treating [name]/Extender: Thomasenia SalesHoffman, Jessica Vanderburg, Laura Weeks in Treatment: 0 Clinic Level of Care Assessment Items TOOL 2 Quantity Score X- 1 0 Use when only an EandM is performed on the INITIAL visit ASSESSMENTS - Nursing Assessment / Reassessment X- 1 20 General Physical Exam (combine w/ comprehensive assessment (listed just below) when performed on new pt. evals) X- 1 25 Comprehensive Assessment (HX, ROS, Risk Assessments, Wounds Hx, etc.) ASSESSMENTS - Wound and Skin A ssessment / Reassessment []  - 0 Simple Wound Assessment / Reassessment - one wound []  - 0 Complex Wound Assessment / Reassessment - multiple wounds X- 1 10 Dermatologic / Skin Assessment (not related to wound area) ASSESSMENTS - Ostomy and/or Continence Assessment and Care []  - 0 Incontinence Assessment and Management []  - 0 Ostomy Care Assessment and Management (repouching, etc.) PROCESS - Coordination of Care X - Simple Patient / Family Education for ongoing care 1 15 []  - 0 Complex (extensive) Patient / Family Education for ongoing care X- 1 10 Staff obtains ChiropractorConsents, Records, T Results / Process Orders est []  - 0 Staff telephones HHA, Nursing Homes / Clarify orders / etc []  - 0 Routine Transfer to another Facility (non-emergent condition) []  - 0 Routine Hospital Admission (non-emergent condition) []  - 0 New Admissions / Manufacturing engineernsurance Authorizations / Ordering NPWT Apligraf, etc. , []  - 0 Emergency Hospital Admission (emergent condition) X- 1 10 Simple Discharge Coordination []  - 0 Complex (extensive) Discharge Coordination PROCESS - Special Needs []  - 0 Pediatric / Minor Patient Management []  - 0 Isolation Patient Management []  - 0 Hearing / Language / Visual special needs []  - 0 Assessment of Community assistance (transportation, D/C planning, etc.) []  - 0 Additional assistance / Altered mentation []  - 0 Support Surface(s) Assessment (bed, cushion, seat,  etc.) INTERVENTIONS - Wound Cleansing / Measurement []  - 0 Wound Imaging (photographs - any number of wounds) []  - 0 Wound Tracing (instead of photographs) []  - 0 Simple Wound Measurement - one wound []  - 0 Complex Wound Measurement - multiple wounds []  - 0  Simple Wound Cleansing - one wound []  - 0 Complex Wound Cleansing - multiple wounds INTERVENTIONS - Wound Dressings []  - 0 Small Wound Dressing one or multiple wounds []  - 0 Medium Wound Dressing one or multiple wounds []  - 0 Large Wound Dressing one or multiple wounds []  - 0 Application of Medications - injection INTERVENTIONS - Miscellaneous []  - 0 External ear exam []  - 0 Specimen Collection (cultures, biopsies, blood, body fluids, etc.) []  - 0 Specimen(s) / Culture(s) sent or taken to Lab for analysis []  - 0 Patient Transfer (multiple staff / Lift / Similar devices) []  - 0 Simple Staple / Suture removal (25 or less) []  - 0 Complex Staple / Suture removal (26 or more) []  - 0 Hypo / Hyperglycemic Management (close monitor of Blood Glucose) []  - 0 Ankle / Brachial Index (ABI) - do not check if billed separately Has the patient been seen at the hospital within the last three years: Yes Total Score: 90 Level Of Care: New/Established - Level 3 Electronic Signature(s) Signed: 08/14/2021 4:29:25 PM By: RN, BSN Entered By: on 08/14/2021 10:20:26 -------------------------------------------------------------------------------- Encounter Discharge Information Details Patient Name: Date of Service: K. 08/14/2021 9:45 A M Medical Record Number: Patient Account Number: Date of Birth/Sex: Treating RN: 1980-07-25 (41 y.o. Primary Care Irja Wheless: PA , Other Clinician: Referring Kirandeep Fariss: Treating Rilda Bulls/Extender: 10/14/2021 in Treatment: 0 Encounter Discharge Information Items Discharge Condition:  Stable Ambulatory Status: Ambulatory Discharge Destination: Home Transportation: Private Auto Accompanied By: self Schedule Follow-up Appointment: No Clinical Summary of Care: Electronic Signature(s) Signed: 08/14/2021 4:29:25 PM By: Shawn Stall RN, BSN Entered By: 10/14/2021 on 08/14/2021 10:22:13 -------------------------------------------------------------------------------- Lower Extremity Assessment Details Patient Name: Date of Service: 10/14/2021 K. 08/14/2021 9:45 A M Medical Record Number: [id number] Patient Account Number: 03/06/1981 Date of Birth/Sex: Treating RN: 1980/05/04 (41 y.o. Zenovia Jordan Primary Care Ledora Delker: PA [place], Thomasenia Sales Other Clinician: Referring Patra Gherardi: Treating Katrice Goel/Extender: 10/14/2021 in Treatment: 0 Electronic Signature(s) Signed: 08/14/2021 4:29:25 PM By: Shawn Stall RN, BSN Signed: 08/14/2021 4:29:25 PM By: Sherri Fleet RN, BSN Entered By: 10/14/2021 on 08/14/2021 10:03:25 -------------------------------------------------------------------------------- Multi Wound Chart Details Patient Name: Date of Service: [id number] K. 08/14/2021 9:45 A M Medical Record Number: [id number] Patient Account Number: [name] Date of Birth/Sex: Treating RN: 1981/01/14 (41 y.o. Thomasenia Sales Primary Care Harjot Dibello: PA 10/14/2021, Shawn Stall Other Clinician: Referring Mana Morison: Treating Kailena Lubas/Extender: 10/14/2021 in Treatment: 0 Vital Signs Height(in): 61 Pulse(bpm): 67 Weight(lbs): 143 Blood Pressure(mmHg): 127/81 Body Mass Index(BMI): 27 Temperature(F): 98 Respiratory Rate(breaths/min): 16 Wound Assessments Treatment Notes Electronic Signature(s) Signed: 08/14/2021 12:20:38 PM By: Shawn Stall DO Signed: 08/14/2021 4:29:25 PM By: Sherri Fleet RN, BSN Entered By: 10/14/2021 on 08/14/2021  10:29:05 -------------------------------------------------------------------------------- Multi-Disciplinary Care Plan Details Patient Name: Date of Service: [id number] K. 08/14/2021 9:45 A M Medical Record Number: [id number] Patient Account Number: [name] Date of Birth/Sex: Treating RN: [birth year] (41 y.o. Thomasenia Sales Primary Care Jeanclaude Wentworth: PA 10/14/2021, Geralyn Corwin Other Clinician: Referring Tonica Brasington: Treating Starla Deller/Extender: 10/14/2021 in Treatment: 0 Active Inactive Electronic Signature(s) Signed: 08/14/2021 4:29:25 PM By: Geralyn Corwin RN, BSN Entered By: 10/14/2021 on 08/14/2021 10:19:49 -------------------------------------------------------------------------------- Pain Assessment Details Patient Name: Date of Service: 10/14/2021 K. 08/14/2021 9:45 A M Medical Record Number: [id number] Patient Account Number: 03/06/1981 Date of Birth/Sex: Treating  RN: 1980-09-12 (41 y.o. [name] Primary Care Josemaria Brining: PA Zenovia Jordan, [place] Other Clinician: Referring Justino Boze: Treating Shayn Madole/Extender: Thomasenia Sales in Treatment: 0 Active Problems Location of Pain Severity and Description of Pain Patient Has Paino No Site Locations Rate the pain. Current Pain Level: 0 Pain Management and Medication Current Pain Management: Notes per patient at times soreness at sharp pains. currently 0/10. Electronic Signature(s) Signed: 08/14/2021 4:29:25 PM By: Shawn Stall RN, BSN Entered By: Shawn Stall on 08/14/2021 10:03:46 -------------------------------------------------------------------------------- Patient/Caregiver Education Details Patient Name: Date of Service: Sherri Johns 6/12/2023andnbsp9:45 A M Medical Record Number: [id number] Patient Account Number: [id number] Date of Birth/Gender: Treating RN: 1980-12-10 (41 y.o. [name] Primary Care Physician: PA Zenovia Jordan, [place] Other Clinician: Referring  Physician: Treating Physician/Extender: Thomasenia Sales in Treatment: 0 Education Assessment Education Provided To: Patient Education Topics Provided Wound Debridement: Handouts: Wound Debridement Methods: Explain/Verbal Responses: State content correctly Electronic Signature(s) Signed: 08/14/2021 4:29:25 PM By: Shawn Stall RN, BSN Entered By: Shawn Stall on 08/14/2021 10:20:01 -------------------------------------------------------------------------------- Vitals Details Patient Name: Date of Service: Sherri Johns K. 08/14/2021 9:45 A M Medical Record Number: [id number] Patient Account Number: [id number] Date of Birth/Sex: Treating RN: [birth year] (41 y.o. [name] Primary Care Ishmael Berkovich: PA TIENT, [place] Other Clinician: Referring Kiriana Worthington: Treating Kenyon Eshleman/Extender: Thomasenia Sales in Treatment: 0 Vital Signs Time Taken: 10:00 Temperature (F): 98 Height (in): 61 Pulse (bpm): 67 Source: Stated Respiratory Rate (breaths/min): 16 Weight (lbs): 143 Blood Pressure (mmHg): 127/81 Source: Stated Reference Range: 80 - 120 mg / dl Body Mass Index (BMI): 27 Electronic Signature(s) Signed: 08/14/2021 4:29:25 PM By: Shawn Stall RN, BSN Entered By: Shawn Stall on 08/14/2021 10:09:01

## 2021-08-14 NOTE — Progress Notes (Signed)
FAIRY, ASHLOCK ([id number]) Visit Report for 08/14/2021 Abuse Risk Screen Details Patient Name: Date of Service: Sherri Johns 08/14/2021 9:45 A M Medical Record Number: [id number] Patient Account Number: [id number] Date of Birth/Sex: Treating RN: [birth year] (41 y.o. [name] Primary Care Azarias Chiou: PA Zenovia Jordan, [place] Other Clinician: Referring Airam Heidecker: Treating Chance Karam/Extender: Thomasenia Sales in Treatment: 0 Abuse Risk Screen Items Answer ABUSE RISK SCREEN: Has anyone close to you tried to hurt or harm you recentlyo No Do you feel uncomfortable with anyone in your familyo No Has anyone forced you do things that you didnt want to doo No Electronic Signature(s) Signed: 08/14/2021 4:29:25 PM By: Shawn Stall RN, BSN Entered By: Shawn Stall on 08/14/2021 10:02:30 -------------------------------------------------------------------------------- Activities of Daily Living Details Patient Name: Date of Service: Sherri Johns 08/14/2021 9:45 A M Medical Record Number: [id number] Patient Account Number: [id number] Date of Birth/Sex: Treating RN: [birth year] (41 y.o. [name] Primary Care Dalen Hennessee: PA Zenovia Jordan, [place] Other Clinician: Referring Hubbard Seldon: Treating Majid Mccravy/Extender: Thomasenia Sales in Treatment: 0 Activities of Daily Living Items Answer Activities of Daily Living (Please select one for each item) Drive Automobile Completely Able T Medications ake Completely Able Use T elephone Completely Able Care for Appearance Completely Able Use T oilet Completely Able Bath / Shower Completely Able Dress Self Completely Able Feed Self Completely Able Walk Completely Able Get In / Out Bed Completely Able Housework Completely Able Prepare Meals Completely Able Handle Money Completely Able Shop for Self Completely Able Electronic Signature(s) Signed: 08/14/2021 4:29:25 PM By: Shawn Stall RN, BSN Entered By: Shawn Stall on 08/14/2021 10:02:42 -------------------------------------------------------------------------------- Education Screening Details Patient Name: Date of Service: Sherri Fleet K. 08/14/2021 9:45 A M Medical Record Number: [id number] Patient Account Number: [id number] Date of Birth/Sex: Treating RN: 05-26-1980 (41 y.o. [name] Primary Care Duriel Deery: PA Zenovia Jordan, [place] Other Clinician: Referring Chrishana Spargur: Treating Audris Speaker/Extender: Thomasenia Sales in Treatment: 0 Primary Learner Assessed: Patient Learning Preferences/Education Level/Primary Language Learning Preference: Explanation, Demonstration, Printed Material Highest Education Level: College or Above Preferred Language: English Cognitive Barrier Language Barrier: No Translator Needed: No Memory Deficit: No Emotional Barrier: No Cultural/Religious Beliefs Affecting Medical Care: No Physical Barrier Impaired Vision: No Impaired Hearing: No Decreased Hand dexterity: No Knowledge/Comprehension Knowledge Level: High Comprehension Level: High Ability to understand written instructions: High Ability to understand verbal instructions: High Motivation Anxiety Level: Calm Cooperation: Cooperative Education Importance: Acknowledges Need Interest in Health Problems: Asks Questions Perception: Coherent Willingness to Engage in Self-Management High Activities: Readiness to Engage in Self-Management High Activities: Electronic Signature(s) Signed: 08/14/2021 4:29:25 PM By: Shawn Stall RN, BSN Entered By: Shawn Stall on 08/14/2021 10:03:00 -------------------------------------------------------------------------------- Fall Risk Assessment Details Patient Name: Date of Service: Sherri Fleet K. 08/14/2021 9:45 A M Medical Record Number: [id number] Patient Account Number: [id number] Date of Birth/Sex: Treating RN: Mar 01, 1981 (40 y.o. Debara Pickett, [place] Primary Care Dayane Hillenburg: PA TIENT,  [place] Other Clinician: Referring Dmarion Perfect: Treating Khyler Eschmann/Extender: Thomasenia Sales in Treatment: 0 Fall Risk Assessment Items Have you had 2 or more falls in the last 12 monthso 0 No Have you had any fall that resulted in injury in the last 12 monthso 0 No FALLS RISK SCREEN History of falling - immediate or within 3 months 0 No Secondary diagnosis (Do you have 2 or more medical diagnoseso) 0 No Ambulatory aid None/bed rest/wheelchair/nurse 0 Yes Crutches/cane/walker 0 No Furniture 0 No Intravenous therapy Access/Saline/Heparin Boykin Reaper  0 No Gait/Transferring Normal/ bed rest/ wheelchair 0 Yes Weak (short steps with or without shuffle, stooped but able to lift head while walking, may seek 0 No support from furniture) Impaired (short steps with shuffle, may have difficulty arising from chair, head down, impaired 0 No balance) Mental Status Oriented to own ability 0 Yes Electronic Signature(s) Signed: 08/14/2021 4:29:25 PM By: Shawn Stall RN, BSN Entered By: Shawn Stall on 08/14/2021 10:03:07 -------------------------------------------------------------------------------- Foot Assessment Details Patient Name: Date of Service: Sherri Fleet K. 08/14/2021 9:45 A M Medical Record Number: [id number] Patient Account Number: [id number] Date of Birth/Sex: Treating RN: 06/12/1980 (41 y.o. [name] Primary Care Veretta Sabourin: PA [place], [place] Other Clinician: Referring Diamantina Edinger: Treating Vanesha Athens/Extender: Thomasenia Sales in Treatment: 0 Foot Assessment Items Site Locations + = Sensation present, - = Sensation absent, C = Callus, U = Ulcer R = Redness, W = Warmth, M = Maceration, PU = Pre-ulcerative lesion F = Fissure, S = Swelling, D = Dryness Assessment Right: Left: Other Deformity: No No Prior Foot Ulcer: No No Prior Amputation: No No Charcot Joint: No No Ambulatory Status: Gait: Steady Notes NO BLE wounds. Electronic  Signature(s) Signed: 08/14/2021 4:29:25 PM By: Shawn Stall RN, BSN Entered By: Shawn Stall on 08/14/2021 10:03:21 -------------------------------------------------------------------------------- Nutrition Risk Screening Details Patient Name: Date of Service: Sherri Johns. 08/14/2021 9:45 A M Medical Record Number: [id number] Patient Account Number: [id number] Date of Birth/Sex: Treating RN: [birth year] (41 y.o. Debara Pickett, [place] Primary Care Kaan Tosh: PA [place], [place] Other Clinician: Referring Karam Dunson: Treating Junaid Wurzer/Extender: Thomasenia Sales in Treatment: 0 Height (in): 61 Weight (lbs): 143 Body Mass Index (BMI): 27 Nutrition Risk Screening Items Score Screening NUTRITION RISK SCREEN: I have an illness or condition that made me change the kind and/or amount of food I eat 0 No I eat fewer than two meals per day 0 No I eat few fruits and vegetables, or milk products 0 No I have three or more drinks of beer, liquor or wine almost every day 0 No I have tooth or mouth problems that make it hard for me to eat 0 No I don't always have enough money to buy the food I need 0 No I eat alone most of the time 0 No I take three or more different prescribed or over-the-counter drugs a day 0 No Without wanting to, I have lost or gained 10 pounds in the last six months 0 No I am not always physically able to shop, cook and/or feed myself 0 No Nutrition Protocols Good Risk Protocol 0 No interventions needed Moderate Risk Protocol High Risk Proctocol Risk Level: Good Risk Score: 0 Electronic Signature(s) Signed: 08/14/2021 4:29:25 PM By: Shawn Stall RN, BSN Entered By: Shawn Stall on 08/14/2021 10:03:13

## 2021-09-09 ENCOUNTER — Telehealth: Payer: Self-pay | Admitting: Plastic Surgery

## 2021-09-09 NOTE — Telephone Encounter (Signed)
Left a message to call office for appointment change.

## 2021-09-10 ENCOUNTER — Encounter: Payer: Self-pay | Admitting: Plastic Surgery

## 2021-09-13 ENCOUNTER — Institutional Professional Consult (permissible substitution): Payer: Medicaid Other | Admitting: Plastic Surgery

## 2021-10-09 ENCOUNTER — Encounter: Payer: Self-pay | Admitting: Plastic Surgery

## 2021-10-09 ENCOUNTER — Ambulatory Visit: Payer: Medicaid Other | Admitting: Plastic Surgery

## 2021-10-09 VITALS — BP 116/75 | HR 89 | Ht 61.0 in | Wt 144.8 lb

## 2021-10-09 DIAGNOSIS — L910 Hypertrophic scar: Secondary | ICD-10-CM

## 2021-10-09 NOTE — Progress Notes (Signed)
   Referring Provider Camelia Phenes, DO [address] [place],  [place] [address]   CC:  Keloid chest and right arm   Sherri Johns is an 41 y.o. female.  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  Right arm was since April 2023 where she is scraped this on a dog cage in her chest has been there for 4 years.  She had an injection about 2 weeks ago at the dermatologist.    No Known Allergies  Outpatient Encounter Medications as of 10/09/2021  Medication Sig   cephALEXin (KEFLEX) 500 MG capsule Take 1 capsule (500 mg total) by mouth 2 (two) times daily. (Patient not taking: Reported on 10/09/2021)   metroNIDAZOLE (FLAGYL) 500 MG tablet Take 1 tablet (500 mg total) by mouth 2 (two) times daily. One po bid x 7 days (Patient not taking: Reported on 10/09/2021)   nitrofurantoin, macrocrystal-monohydrate, (MACROBID) 100 MG capsule Take 1 capsule (100 mg total) by mouth 2 (two) times daily. X 7 days (Patient not taking: Reported on 10/09/2021)   phenazopyridine (PYRIDIUM) 200 MG tablet Take 1 tablet (200 mg total) by mouth 3 (three) times daily. (Patient not taking: Reported on 10/09/2021)   No facility-administered encounter medications on file as of 10/09/2021.     No past medical history on file.  No past surgical history on file.  Family History  Problem Relation Age of Onset   Breast cancer Mother    Breast cancer Sister    Breast cancer Paternal Aunt     Social History   Social History Narrative   Not on file     Review of Systems General: Denies fevers, chills, weight loss CV: Denies chest pain, shortness of breath, palpitations   Physical Exam    06/15/2021    3:58 PM 04/21/2014    9:12 AM 04/21/2014    7:51 AM  Vitals with BMI  Height   5\' 0"   Weight   130 lbs  BMI   25.4  Systolic 116 108  Diastolic 75 60 72  Pulse 66 72 73    General:  No acute distress,  Alert and oriented, Non-Toxic, Normal speech and affect Chest: 1  cm x 5.5 cm keloid scar Extremity 1.5 cm x 3 mm right arm keloid scar  Assessment/Plan I recommended additional injection which she agrees to.  We will perform this in about 3 to 4 weeks given that she just had an injection on July 19.  She also mentions steroid taper and we could give her a trial with steroid tape if she would like.    July 21 10/09/2021, 1:48 PM

## 2021-11-08 ENCOUNTER — Ambulatory Visit (INDEPENDENT_AMBULATORY_CARE_PROVIDER_SITE_OTHER): Payer: Medicaid Other | Admitting: Plastic Surgery

## 2021-11-08 DIAGNOSIS — L910 Hypertrophic scar: Secondary | ICD-10-CM | POA: Diagnosis not present

## 2021-11-09 NOTE — Progress Notes (Signed)
Operative Note   DATE OF OPERATION: 11/09/2021  LOCATION:    SURGICAL DEPARTMENT: Plastic Surgery  PREOPERATIVE DIAGNOSES: Keloid right arm and chest  POSTOPERATIVE DIAGNOSES:  same  PROCEDURE:  Kenalog injection to right arm and chest  SURGEON: Melton Alar. Gary Gabrielsen, MD  ANESTHESIA:  Local  COMPLICATIONS: None.   INDICATIONS FOR PROCEDURE:  The patient, Sherri Johns is a 41 y.o. female born on [birth year], is here for treatment of right arm and chest keloid MRN: [id number]  CONSENT:  Informed consent was obtained directly from the patient. Risks, benefits and alternatives were fully discussed.   DESCRIPTION OF PROCEDURE:  A mixture of 1 cc of Kenalog 40 and 1 cc of lidocaine 1% with epinephrine was injected into the central chest and right arm keloid.  The patient tolerated procedure well.  The mixture was evenly distributed throughout the keloids.  The patient tolerated the procedure well.  There were no complications.

## 2021-12-19 ENCOUNTER — Telehealth: Payer: Self-pay | Admitting: Plastic Surgery

## 2021-12-19 NOTE — Telephone Encounter (Signed)
LVM and My Chart message to ask pt to come in at a later time at 1:15 on 10/26 instead of 10 am due to provider scheduling conflict. Asked pt to call office asap to r/s.

## 2021-12-27 ENCOUNTER — Ambulatory Visit (INDEPENDENT_AMBULATORY_CARE_PROVIDER_SITE_OTHER): Payer: Medicaid Other | Admitting: Plastic Surgery

## 2021-12-27 DIAGNOSIS — L818 Other specified disorders of pigmentation: Secondary | ICD-10-CM | POA: Diagnosis not present

## 2021-12-27 DIAGNOSIS — L910 Hypertrophic scar: Secondary | ICD-10-CM

## 2021-12-27 NOTE — Progress Notes (Signed)
   Referring Provider No referring provider defined for this encounter.   CC:  Keloid right arm and central chest  Sherri Johns is an 41 y.o. female.  HPI: Patient is a 41 year old who presented for injection of keloid but she noted she had significant hypopigmentation.  She decided she did not want to have further injections at this time.  We discussed this and I think this is reasonable.  Review of Systems General: No fever or chills  Physical Exam    10/09/2021    1:49 PM 06/15/2021    3:58 PM 04/21/2014    9:12 AM  Vitals with BMI  Height 5\' 1"     Weight 144 lbs 13 oz    BMI 53.64    Systolic 680 321 224  Diastolic 75 75 60  Pulse 89 66 72    General:  No acute distress,  Alert and oriented, Non-Toxic, Normal speech and affect Skin: Chest keloid is flattened but has some hypopigmentation surrounding, right arm keloid is flat but has some hypopigmentation surrounding.  Assessment/Plan We will plan to defer further injections at this time due to significant hypopigmentation.  The patient is aware that if these lesions become bothersome and are itching or burning she can resume injections at the risk of hyperpigmentation  Sherri Johns 12/27/2021, 11:08 AM

## 2021-12-28 ENCOUNTER — Ambulatory Visit: Payer: Medicaid Other | Admitting: Plastic Surgery

## 2022-03-23 IMAGING — MG MM DIGITAL SCREENING BILAT W/ TOMO AND CAD
6 of 10 series · 6 of 30 positions shown · non-contrast
Comparison: None.

CLINICAL DATA: Screening.

EXAM:
DIGITAL SCREENING BILATERAL MAMMOGRAM WITH TOMOSYNTHESIS AND CAD
TECHNIQUE: Bilateral screening digital craniocaudal and mediolateral oblique
mammograms were obtained. Bilateral screening digital breast
tomosynthesis was performed. The images were evaluated with
computer-aided detection.

[R MLO synth-2D (1 of 2)]
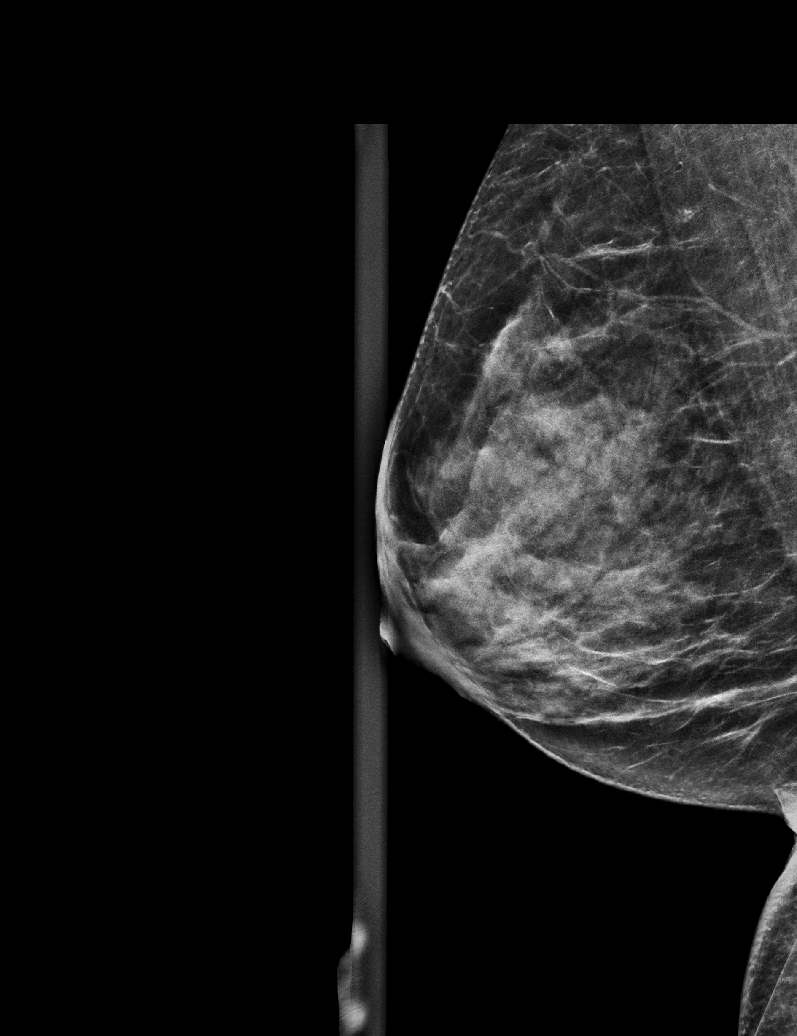

[R MLO synth-2D (2 of 2)]
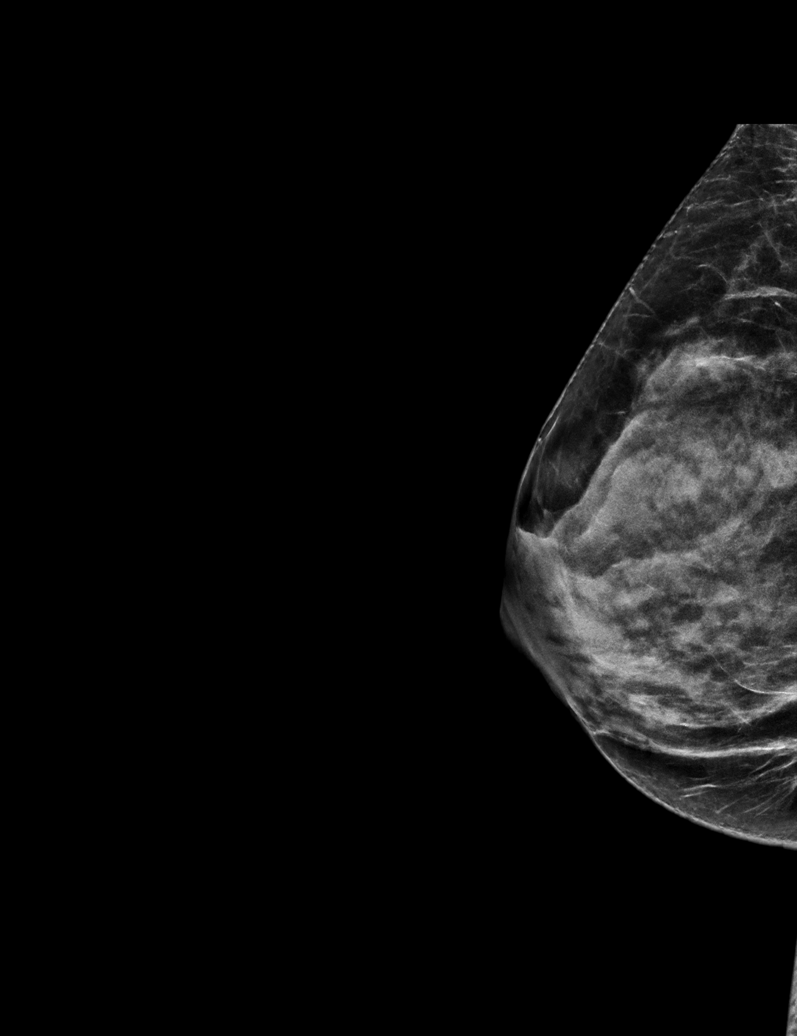

[R CC synth-2D]
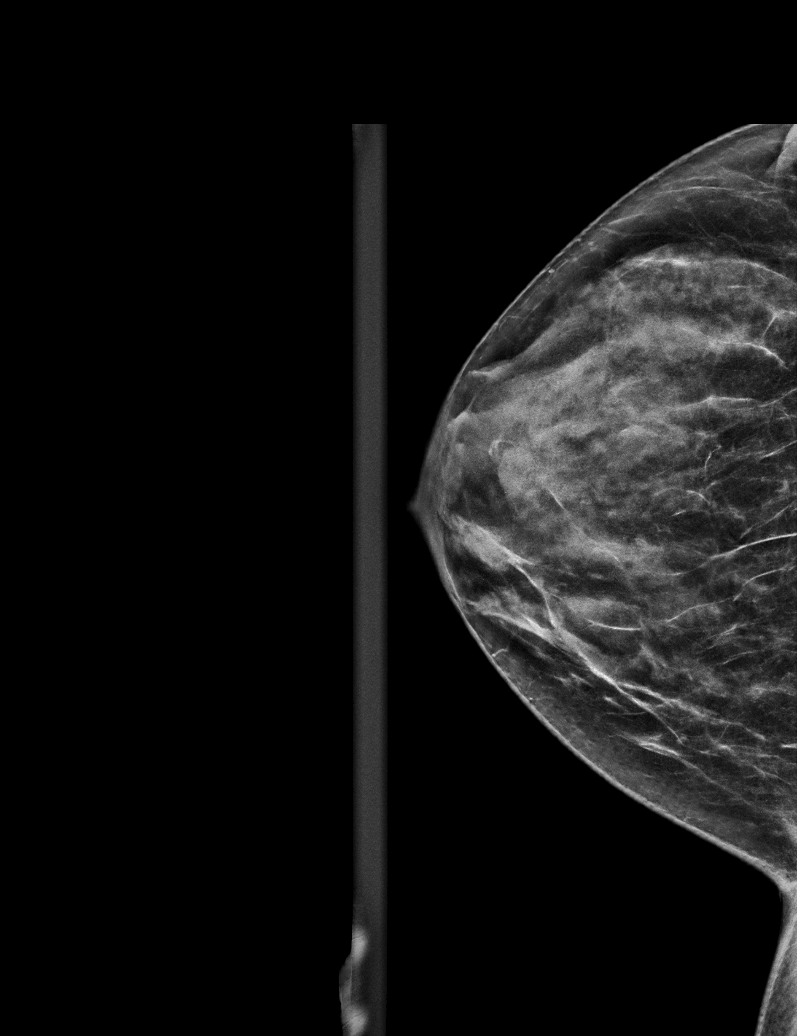

[L CC synth-2D]
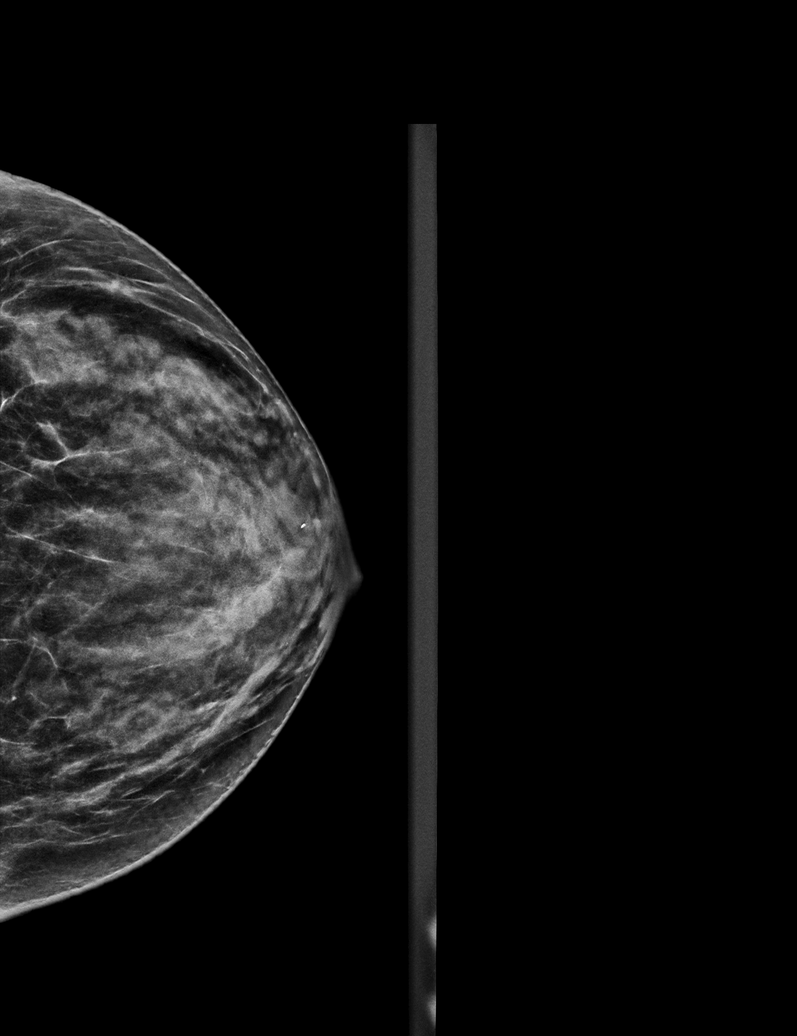

[L MLO synth-2D]
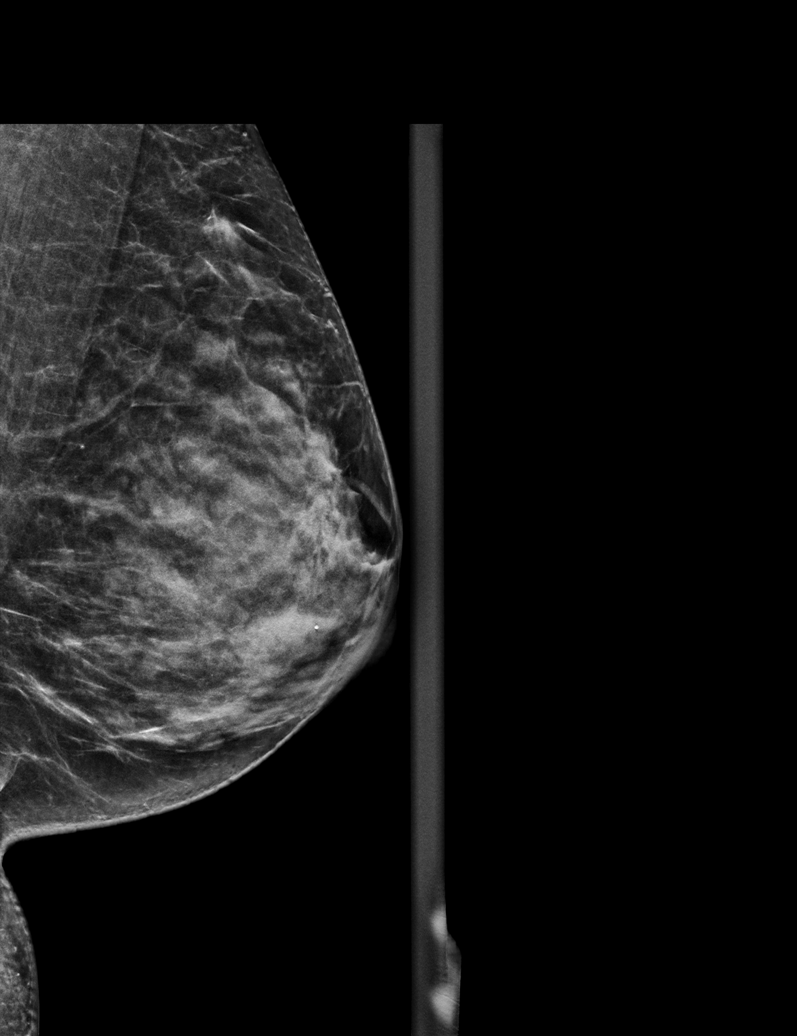

[R MLO tomo · tomo slice 27/53.0]
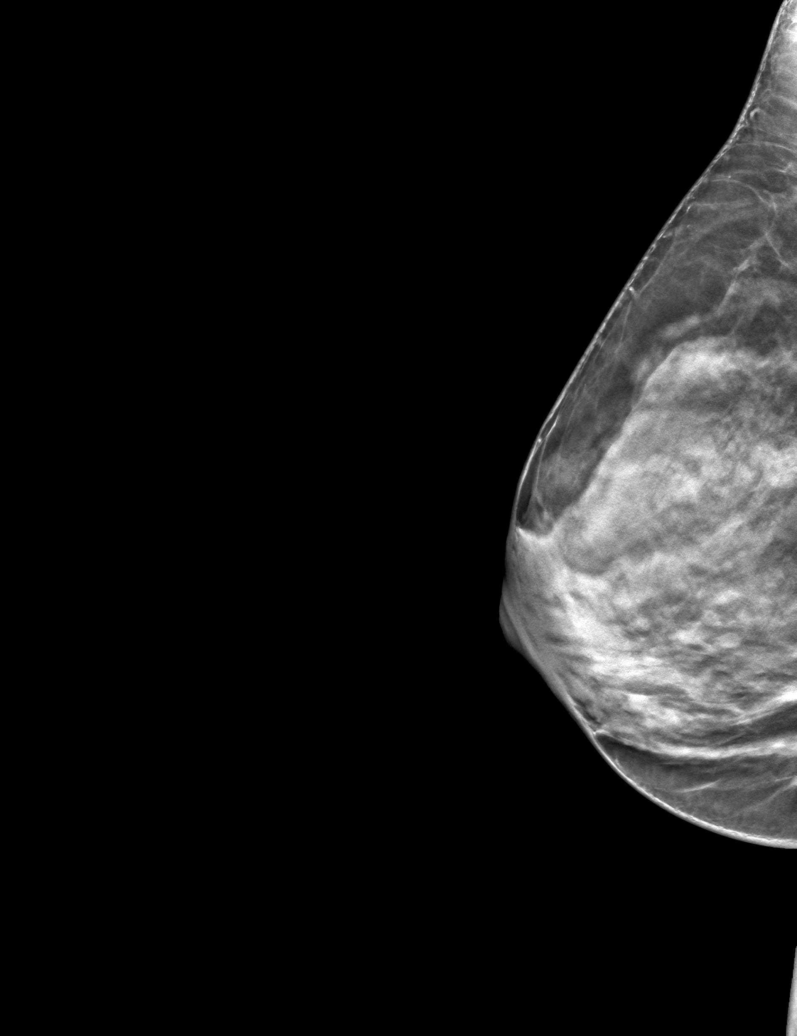

[6 of 30 positions shown; findings below may reference images not displayed]

ACR Breast Density Category d: The breast tissue is extremely dense,
which lowers the sensitivity of mammography.
FINDINGS: There are no findings suspicious for malignancy.
IMPRESSION: No mammographic evidence of malignancy. A result letter of this
screening mammogram will be mailed directly to the patient.

RECOMMENDATION:
Screening mammogram at age 42. (Code:03-S-30G)

BI-RADS CATEGORY  1: Negative.

## 2022-12-10 ENCOUNTER — Ambulatory Visit: Payer: Medicaid Other | Admitting: Obstetrics & Gynecology

## 2022-12-10 ENCOUNTER — Encounter: Payer: Self-pay | Admitting: Obstetrics & Gynecology

## 2022-12-10 VITALS — BP 104/68 | HR 65 | Ht 61.0 in | Wt 144.0 lb

## 2022-12-10 DIAGNOSIS — N907 Vulvar cyst: Secondary | ICD-10-CM | POA: Diagnosis not present

## 2022-12-10 NOTE — Progress Notes (Signed)
   GYNECOLOGY OFFICE VISIT NOTE  History:   Sherri Johns is a 42 y.o. G2P1011 here today for evaluation of right labial cyst, evaluated by her PCP who was concerned about Bartholin's cyst. Patient had noticed lump about 4-5 months ago on inner right labia. It has stayed the same size, no drainage, no tenderness.  She denies any abnormal vaginal discharge, bleeding, pelvic pain or other concerns.    History reviewed. No pertinent past medical history.  History reviewed. No pertinent surgical history.  The following portions of the patient's history were reviewed and updated as appropriate: allergies, current medications, past family history, past medical history, past social history, past surgical history and problem list.   Health Maintenance:  Normal pap and negative HRHPV on 02/13/2021 at [facility].    Review of Systems:  Pertinent items noted in HPI and remainder of comprehensive ROS otherwise negative.  Physical Exam:  BP 104/68   Pulse 65   Ht 5\' 1"  (1.549 m)   Wt 144 lb (65.3 kg)   LMP 11/19/2022 (Approximate)   BMI 27.21 kg/m  CONSTITUTIONAL: Well-developed, well-nourished female in no acute distress.  HEENT:  Normocephalic, atraumatic. External right and left ear normal. No scleral icterus.  NECK: Normal range of motion, supple, no masses noted on observation SKIN: No rash noted. Not diaphoretic. No erythema. No pallor. MUSCULOSKELETAL: Normal range of motion. No edema noted. NEUROLOGIC: Alert and oriented to person, place, and time. Normal muscle tone coordination. No cranial nerve deficit noted. PSYCHIATRIC: Normal mood and affect. Normal behavior. Normal judgment and thought content. CARDIOVASCULAR: Normal heart rate noted RESPIRATORY: Effort and breath sounds normal, no problems with respiration noted ABDOMEN: No masses noted. No other overt distention noted.   PELVIC: 1 cm subcutaneous cyst noted in the upper-mid portion of the right labium minus.  No  discoloraiton, no erythema. Fluctuant and mobile, no tenderness.  Otherwise, normal appearing external genitalia; normal urethral meatus; normal appearing distal vaginal mucosa.  No abnormal discharge noted.  Performed in the presence of a chaperone     Assessment and Plan:     1. Labial cyst Benign labial cyst, patient reassured.   Offered incision and drainage, patient wants to defer for now as she is asymptomatic. She was told to come back for worsening/concerning symptoms.   Please refer to After Visit Summary for other counseling recommendations.   Return for any gynecologic concerns.    I spent 30 minutes dedicated to the care of this patient including pre-visit review of records, face to face time with the patient discussing her conditions and treatments and post visit orders.    Jaynie Collins, MD, FACOG Obstetrician & Gynecologist, [facility] for [employer], [facility]

## 2024-05-14 ENCOUNTER — Ambulatory Visit: Admitting: Obstetrics & Gynecology
